# Patient Record
Sex: Male | Born: 1960 | Race: White | Hispanic: No | Marital: Married | Smoking: Never smoker
Health system: Southern US, Community
[De-identification: ages and names within clinical notes are randomized; demographics above are authoritative.]

## PROBLEM LIST (undated history)

## (undated) DIAGNOSIS — K219 Gastro-esophageal reflux disease without esophagitis: Secondary | ICD-10-CM

## (undated) DIAGNOSIS — I809 Phlebitis and thrombophlebitis of unspecified site: Secondary | ICD-10-CM

## (undated) DIAGNOSIS — E7800 Pure hypercholesterolemia, unspecified: Secondary | ICD-10-CM

## (undated) DIAGNOSIS — I10 Essential (primary) hypertension: Secondary | ICD-10-CM

## (undated) DIAGNOSIS — K2270 Barrett's esophagus without dysplasia: Secondary | ICD-10-CM

## (undated) HISTORY — DX: Phlebitis and thrombophlebitis of unspecified site: I80.9

## (undated) HISTORY — PX: UPPER GASTROINTESTINAL ENDOSCOPY: SHX188

## (undated) HISTORY — DX: Barrett's esophagus without dysplasia: K22.70

## (undated) HISTORY — DX: Gastro-esophageal reflux disease without esophagitis: K21.9

## (undated) HISTORY — DX: Essential (primary) hypertension: I10

---

## 2002-05-22 ENCOUNTER — Emergency Department (HOSPITAL_COMMUNITY): Admission: EM | Admit: 2002-05-22 | Discharge: 2002-05-22 | Payer: Self-pay | Admitting: *Deleted

## 2005-11-23 ENCOUNTER — Ambulatory Visit: Payer: Self-pay | Admitting: Internal Medicine

## 2006-06-17 ENCOUNTER — Ambulatory Visit (HOSPITAL_COMMUNITY): Admission: RE | Admit: 2006-06-17 | Discharge: 2006-06-17 | Payer: Self-pay | Admitting: Internal Medicine

## 2006-06-17 DIAGNOSIS — I809 Phlebitis and thrombophlebitis of unspecified site: Secondary | ICD-10-CM

## 2006-06-17 HISTORY — DX: Phlebitis and thrombophlebitis of unspecified site: I80.9

## 2006-12-30 ENCOUNTER — Ambulatory Visit (HOSPITAL_COMMUNITY): Admission: RE | Admit: 2006-12-30 | Discharge: 2006-12-30 | Payer: Self-pay | Admitting: Family Medicine

## 2007-01-21 ENCOUNTER — Ambulatory Visit (HOSPITAL_COMMUNITY): Admission: RE | Admit: 2007-01-21 | Discharge: 2007-01-21 | Payer: Self-pay | Admitting: Urology

## 2007-08-22 ENCOUNTER — Ambulatory Visit: Payer: Self-pay | Admitting: Internal Medicine

## 2007-08-26 ENCOUNTER — Ambulatory Visit (HOSPITAL_COMMUNITY): Admission: RE | Admit: 2007-08-26 | Discharge: 2007-08-26 | Payer: Self-pay | Admitting: Internal Medicine

## 2007-08-26 ENCOUNTER — Ambulatory Visit: Payer: Self-pay | Admitting: Internal Medicine

## 2007-11-20 ENCOUNTER — Ambulatory Visit (HOSPITAL_COMMUNITY): Admission: RE | Admit: 2007-11-20 | Discharge: 2007-11-20 | Payer: Self-pay | Admitting: Otolaryngology

## 2008-09-23 IMAGING — CT CT PELVIS W/O CM
2 of 4 series · 17 of 46 positions shown, 19 images · non-contrast
Comparison: none

CLINICAL DATA: Hematuria, bilateral flank pain

[Series 2: stone 5.0 b40f · axial · 0.73mm/px · z∈[-493,-58]mm · 14 of 95 slices shown, 16 images]
[im 4/95  soft-tissue]
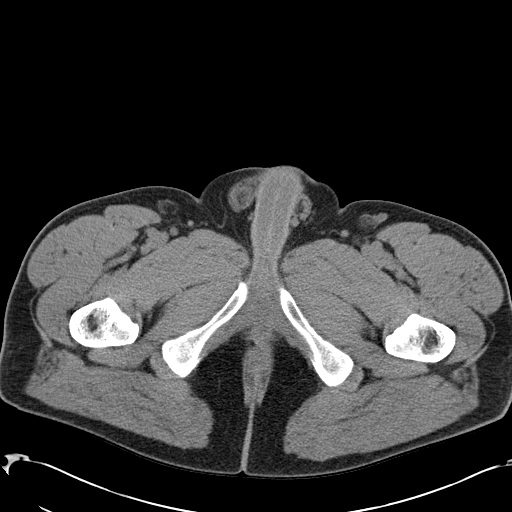
[im 4/95  bone]
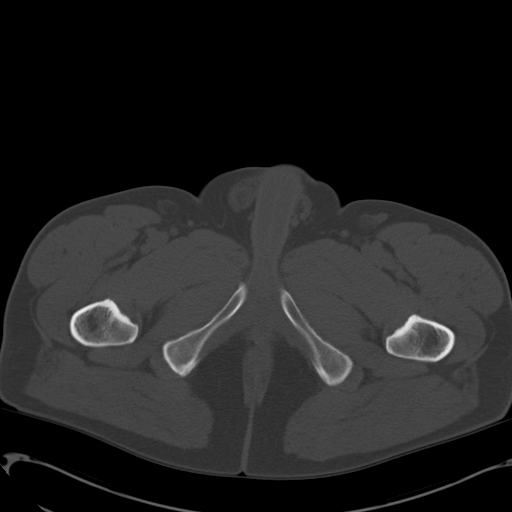
[im 12/95  soft-tissue]
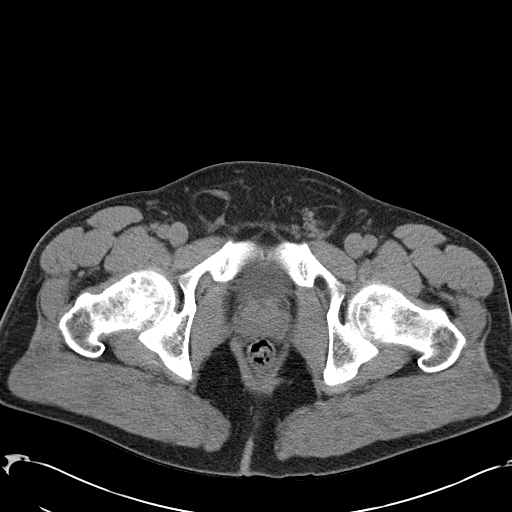
[im 19/95  soft-tissue]
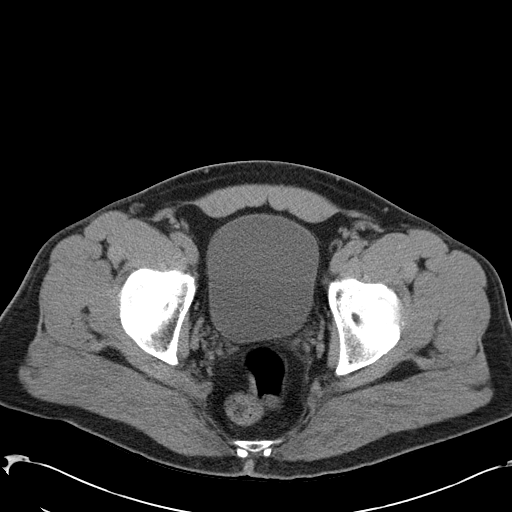
[im 27/95  soft-tissue]
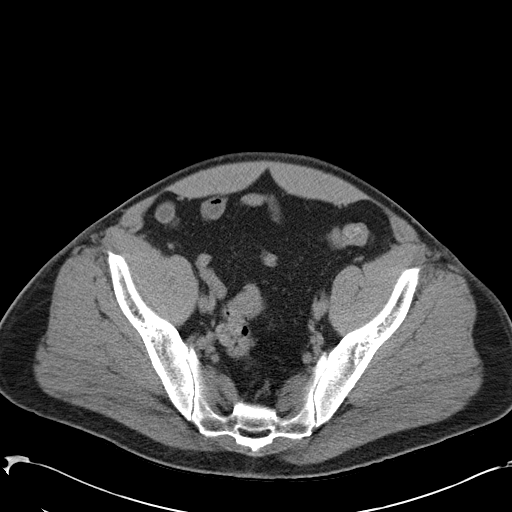
[im 31/95  soft-tissue]
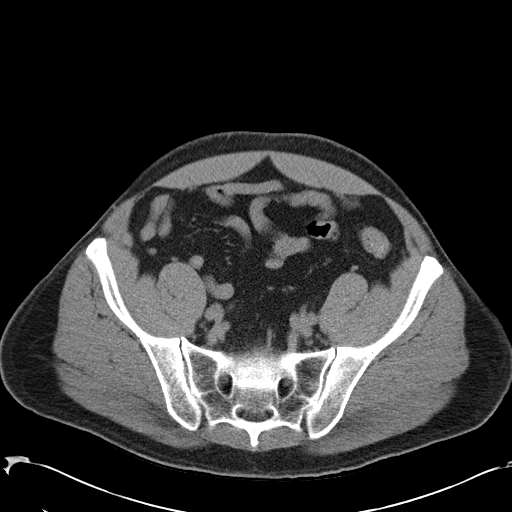
[im 38/95  soft-tissue]
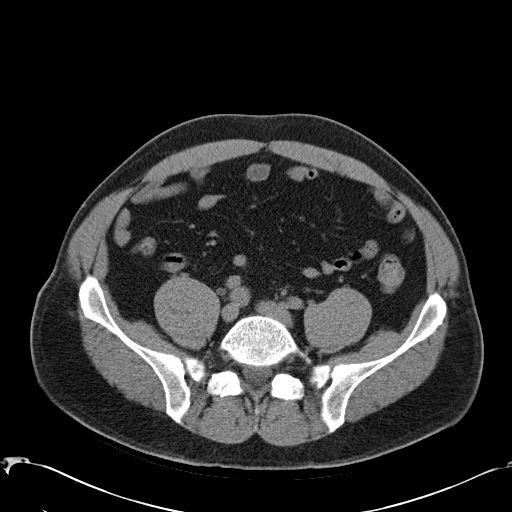
[im 46/95  soft-tissue]
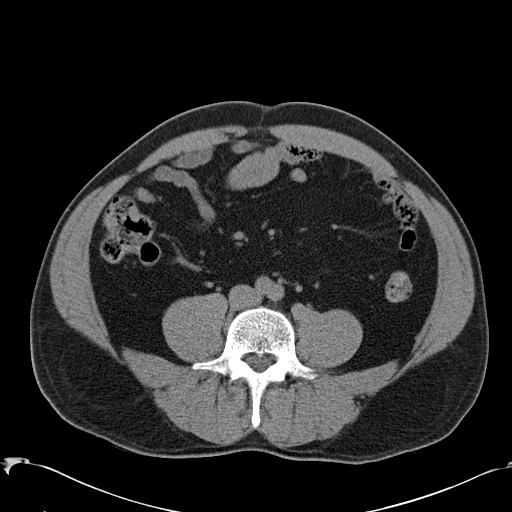
[im 49/95  soft-tissue]
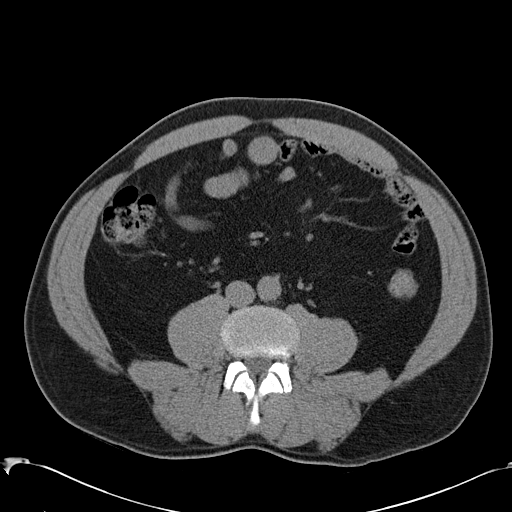
[im 57/95  soft-tissue]
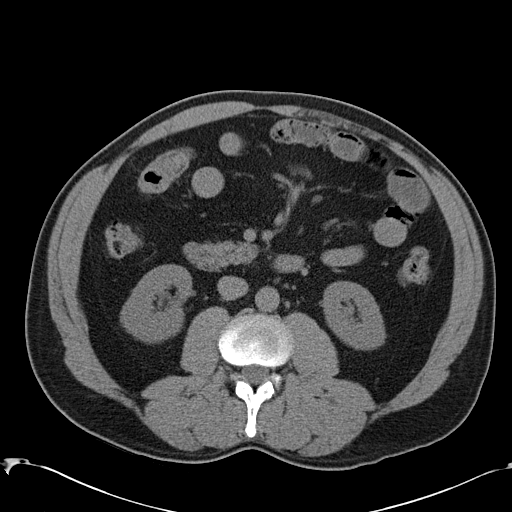
[im 57/95  bone]
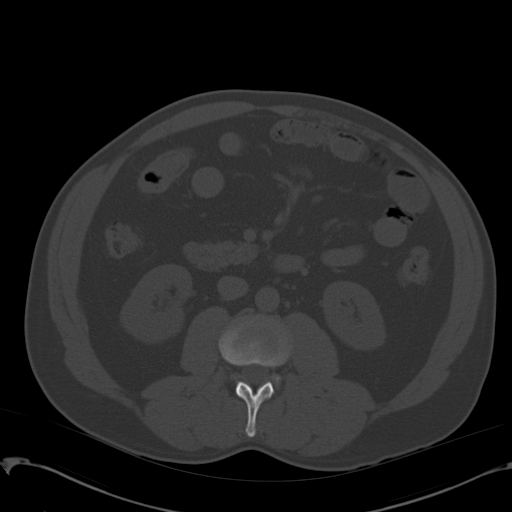
[im 64/95  soft-tissue]
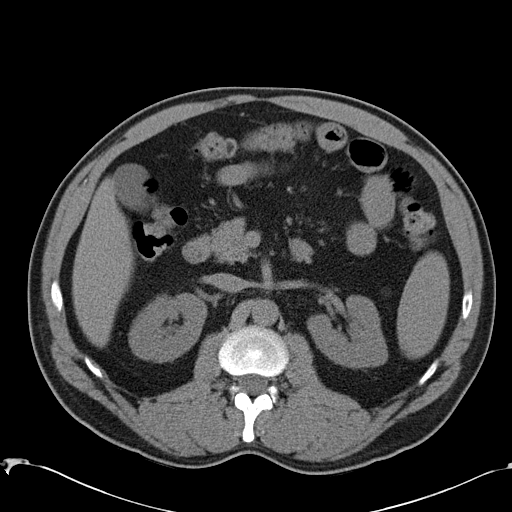
[im 72/95  soft-tissue]
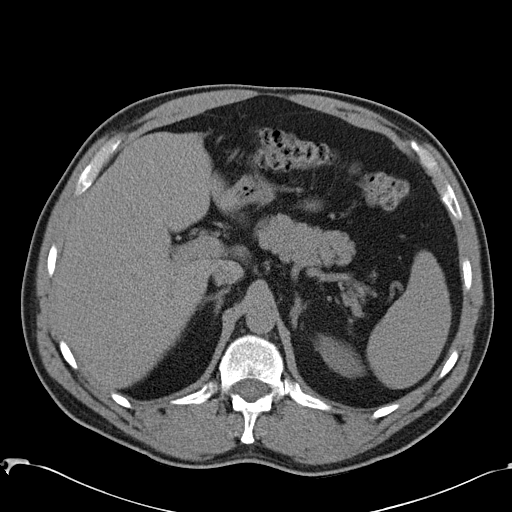
[im 76/95  soft-tissue]
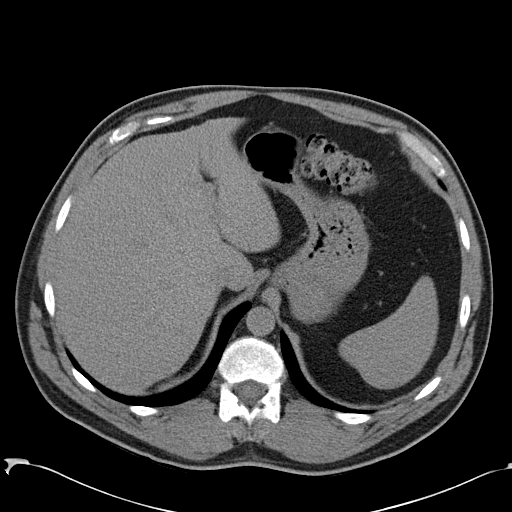
[im 83/95  soft-tissue]
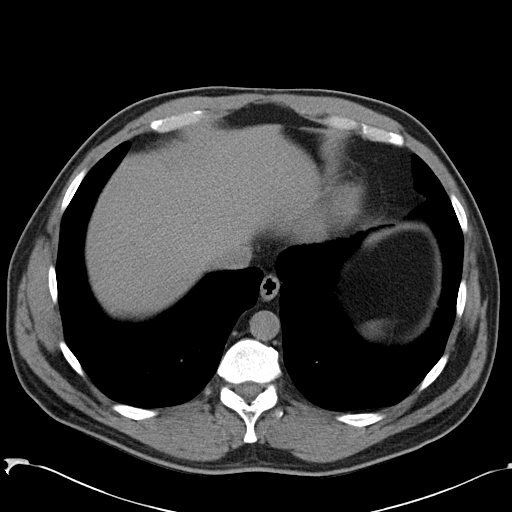
[im 91/95  soft-tissue]
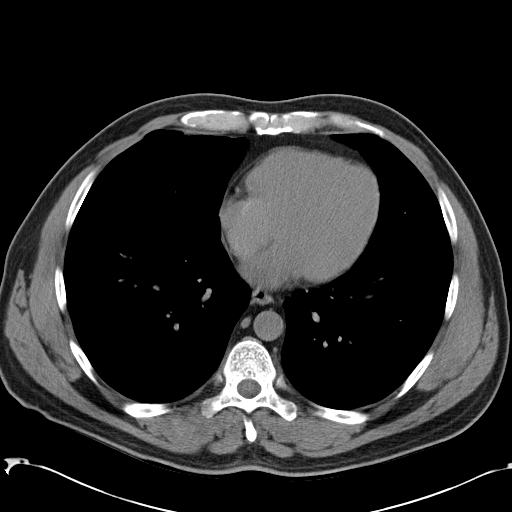

[Series 4: mpr coronal · coronal · 0.93mm/px · 3 of 86 slices shown]
[im 29/86  soft-tissue]
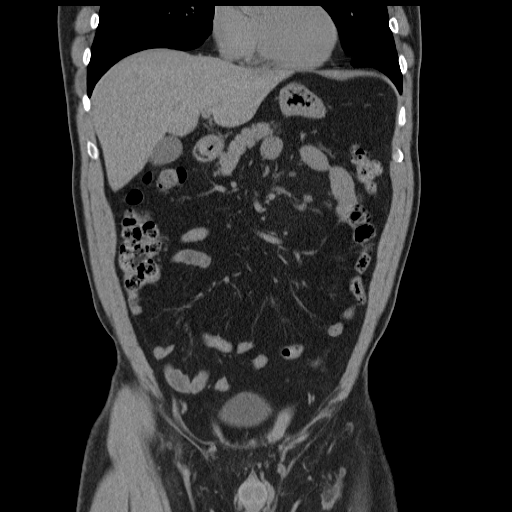
[im 38/86  soft-tissue]
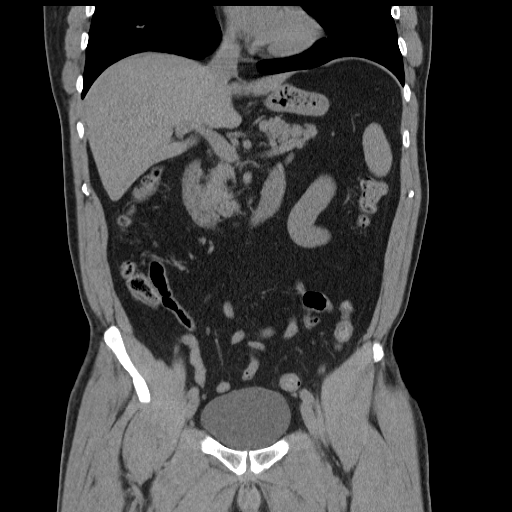
[im 48/86  soft-tissue]
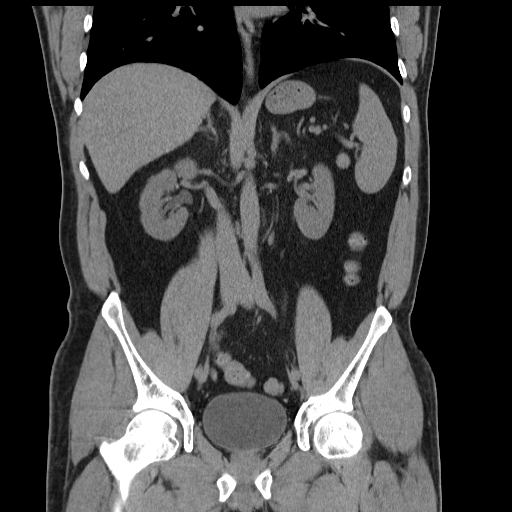

[17 of 46 positions shown; findings below may reference images not displayed]

CT abdomen without contrast:

No previous available for comparison. Visualized lung bases clear. Negative for
nephrolithiasis or hydronephrosis. Unremarkable noncontrast evaluation of
kidneys, adrenal glands, liver, nondistended gallbladder, pancreas, spleen and
accessory splenule. Minimal atheromatous change in the nondilated aorta. No
adenopathy localized. Small bowel is nondilated. No free air. No ascites.
IMPRESSION: 1. Negative for nephrolithiasis or hydronephrosis. Unremarkable CT abdomen.

CT pelvis without contrast:

Normal appendix. Colon is decompressed, unremarkable. No free fluid. Urinary
bladder physiologically distended. No adenopathy. Distal ureters decompressed
without calculus.
IMPRESSION: 1. Negative for distal ureteral calculus.
2. Normal appendix

## 2009-01-20 HISTORY — PX: NISSEN FUNDOPLICATION: SHX2091

## 2009-02-11 ENCOUNTER — Ambulatory Visit (HOSPITAL_COMMUNITY): Admission: RE | Admit: 2009-02-11 | Discharge: 2009-02-12 | Payer: Self-pay | Admitting: General Surgery

## 2009-03-10 ENCOUNTER — Encounter: Payer: Self-pay | Admitting: Internal Medicine

## 2009-04-11 ENCOUNTER — Encounter: Payer: Self-pay | Admitting: Internal Medicine

## 2009-11-10 ENCOUNTER — Encounter: Payer: Self-pay | Admitting: Internal Medicine

## 2009-12-14 ENCOUNTER — Ambulatory Visit (HOSPITAL_COMMUNITY): Admission: RE | Admit: 2009-12-14 | Discharge: 2009-12-14 | Payer: Self-pay | Admitting: Gastroenterology

## 2010-01-23 ENCOUNTER — Encounter (INDEPENDENT_AMBULATORY_CARE_PROVIDER_SITE_OTHER): Payer: Self-pay

## 2010-11-21 NOTE — Letter (Signed)
Summary: OFFICE NOTE/DR ROSENBOWER  OFFICE NOTE/DR ROSENBOWER   Imported By: Diana Eves 11/10/2009 10:18:17  _____________________________________________________________________  External Attachment:    Type:   Image     Comment:   External Document

## 2010-11-21 NOTE — Letter (Signed)
Summary: Recall Colonoscopy/Endoscopy, Change to Office Visit  [facility]  [address]   [place], [place] [address]   Phone: [phone]  Fax: [phone]      January 23, 2010   [name] [address] [place], [place]  [address] Oct 17, 1961   Dear [name],   According to our records, it is time for you to schedule a screening  colonoscopy.  Please call [phone] at your convenience  and ask to speak to the triage nurse to schedule your colonoscopy.   Sincerely,   Cloria Spring LPN  [facility] Ph: [phone]   Fax: [phone]

## 2011-01-31 LAB — DIFFERENTIAL
Basophils Absolute: 0 10*3/uL (ref 0.0–0.1)
Basophils Relative: 0 % (ref 0–1)
Lymphocytes Relative: 31 % (ref 12–46)
Monocytes Relative: 8 % (ref 3–12)
Neutrophils Relative %: 59 % (ref 43–77)

## 2011-01-31 LAB — COMPREHENSIVE METABOLIC PANEL
Alkaline Phosphatase: 54 U/L (ref 39–117)
BUN: 19 mg/dL (ref 6–23)
CO2: 30 mEq/L (ref 19–32)
Calcium: 9.6 mg/dL (ref 8.4–10.5)
Chloride: 105 mEq/L (ref 96–112)
Glucose, Bld: 121 mg/dL — ABNORMAL HIGH (ref 70–99)
Potassium: 4.5 mEq/L (ref 3.5–5.1)
Total Bilirubin: 0.8 mg/dL (ref 0.3–1.2)

## 2011-01-31 LAB — CBC
Hemoglobin: 16.5 g/dL (ref 13.0–17.0)
MCHC: 34.6 g/dL (ref 30.0–36.0)
MCV: 96.5 fL (ref 78.0–100.0)
WBC: 4.8 10*3/uL (ref 4.0–10.5)

## 2011-01-31 LAB — TYPE AND SCREEN: Antibody Screen: NEGATIVE

## 2011-01-31 LAB — ABO/RH: ABO/RH(D): A POS

## 2011-03-06 NOTE — H&P (Signed)
Browning, Jonathan           ACCOUNT NO.:  [id number]   MEDICAL RECORD NO.:  [id number]          PATIENT TYPE:  AMB   LOCATION:  DAY                           FACILITY:  [facility]   PHYSICIAN:  R. Roetta Sessions, M.D. DATE OF BIRTH:  1961/02/14   DATE OF ADMISSION:  DATE OF DISCHARGE:  LH                              HISTORY & PHYSICAL   CHIEF COMPLAINT:  Hoarse, heartburn.   HISTORY OF PRESENT ILLNESS:  [name] is a 50 year old gentleman who  presents today for further evaluation of uncontrollable heartburn and  hoarseness.  He was last seen by our practice in February, 2007.  He has  chronic GERD and IBS.  He states that he was doing very well up until 5-  6 weeks ago.  He had been off and on several medications for  cholesterol, but he had to stop due to side effects and muscle pain.  About four weeks ago, he started taking Benicar/HCT and about four weeks  into it, he started noticing that he was having hoarseness.  He stopped  the medication.  He describes the symptoms coming on abruptly.  He woke  up one morning with them.  He could hardly talk.  He saw Dr. Margo Aye.  His  Nexium was increased to twice a day.  He has been  on this for about  three weeks.  He states that this hoarseness has improved but not  resolved.  He continues to have breakthrough heartburn symptoms.  He  also takes red yeast rice pills for his cholesterol.  He just completed  a six week course of Bactrim for prostatitis.  He denies any dysphagia,  odynophagia, abdominal pain, constipation, diarrhea, melena, or rectal  bleeding.   CURRENT MEDICATIONS:  1. Nexium 40 mg b.i.d.  2. Red yeast pills daily.   ALLERGIES:  CIPRO, MAXIQUIN, FLOXIN.   PAST MEDICAL HISTORY:  1. Hypertension.  2. Hypercholesterolemia.  3. Chronic GERD.  4. IBS.  5. Prostatitis.   His last EGD was in July, 1998 by Dr. Karilyn Cota.  He had a single erosion  at the EG junction with a very small hiatal hernia.  The esophagus was  dilated  with a 54 French Maloney dilator, but no ring appeared either  before or after dilation.  He had a flexible sigmoidoscopy in March,  1995 and had acute colitis with patchy involvement of the sigmoid colon.  Biopsies revealed chronic idiopathic colitis.  It was felt this could be  due to infectious etiology.  The patient really has had no problems at  that time.   No prior surgeries.   FAMILY HISTORY:  Negative for colorectal cancer.   SOCIAL HISTORY:  He is married with two children.  He is self-employed.  He is a nonsmoker.  No alcohol use.   REVIEW OF SYSTEMS:  See HPI for GI.  CONSTITUTIONAL:  He has lost 13  pounds, which has been unintentional.  He is exercising daily.  CARDIOPULMONARY:  No chest pain or shortness of breath.  HEENT:  Complains of hoarseness with no sore throat.   PHYSICAL EXAMINATION:  Weight 190.  Height 5 feet 8.  Temp 98.5, blood  pressure 142/98, pulse 76.  GENERAL:  A pleasant, well-developed and well-nourished Caucasian male  in no acute distress.  SKIN:  Warm and dry.  No jaundice.  HEENT:  Sclerae are anicteric.  Oropharyngeal mucosa moist and pink.  No  lesions, erythema, or exudates.  No lymphadenopathy, thyromegaly.  CHEST:  Lungs are clear to auscultation.  CARDIAC:  Regular rate and rhythm.  Normal S1 and S2.  No murmurs, rubs  or gallops.  ABDOMEN:  Positive bowel sounds.  Abdomen is soft, nontender and  nondistended.  No organomegaly or masses.  No rebound tenderness or  guarding.  No abdominal bruits or hernias.  EXTREMITIES:  No edema.   IMPRESSION:  History of chronic gastroesophageal reflux disease with  breakthrough symptoms, now on Nexium 40 mg b.i.d.  He also complains of  hoarseness, which has been occurring for about five weeks.  This may be  due to laryngopharyngeal reflux.  It has been 10 years since his last  endoscopy.  Recommend one at this time for further evaluation of his  symptoms and to rule out complications of GERD.  I  discussed risks,  benefits and alternatives with the patient with regards to risk of  reaction to medication, bleeding, infection, perforation.  He is  agreeable to proceed.   PLAN:  1. EGD in the near future with Dr. Jena Gauss.  2. Recommend screening colonoscopy at age 54.      Tana Coast, P.AJonathon Bellows, M.D.  Electronically Signed    LL/MEDQ  D:  08/22/2007  T:  08/23/2007  Job:  [id number]   cc:   Catalina Pizza, M.D.  Fax: [phone]

## 2011-03-06 NOTE — Op Note (Signed)
[name], [name]           ACCOUNT NO.:  [id number]   MEDICAL RECORD NO.:  [id number]          PATIENT TYPE:  AMB   LOCATION:  DAY                           FACILITY:  [facility]   PHYSICIAN:  R. Roetta Sessions, M.D. DATE OF BIRTH:  12-07-1960   DATE OF PROCEDURE:  08/26/2007  DATE OF DISCHARGE:                               OPERATIVE REPORT   PROCEDURE:  Diagnostic EGD.   INDICATIONS FOR PROCEDURE:  50 year old gentleman with longstanding  history of gastroesophageal reflux disease which he describes as  heartburn.  He has been on Nexium 40 mg orally daily chronically.  Regimen was increased to b.i.d. early September after he had became  acutely hoarse.  Hoarseness has waxed and waned since that time.  His  typical reflux symptoms have been ameliorated but not completely  controlled with 40 mg of Nexium b.i.d.  He is not having any dysphagia.  He does not use any tobacco products and no prior significant tobacco  exposure.  EGD now being done to further evaluate her refractory  gastroesophageal reflux disease symptoms.  This approach has been  discussed with the patient previously and again at the bedside.  His  questions were answered.  Risks, benefits, and alternatives have been  reviewed.   PROCEDURE NOTE:  O2 saturation, blood pressure, pulse, and respirations  were monitored throughout the entire procedure.  Conscious sedation:  Versed 6 mg IV, Demerol 125 mg IV in divided doses.  Cetacaine spray for  topical oropharyngeal anesthesia.  Instrument:  Pentax video chip  system.   FINDINGS:  Examination of the hypopharynx, I looked at the vocal cords.  They appeared to be quite subtle and obscured by the surrounding mucosa  with some patchy redness.  Please see photos.  They appeared rather  inapparent.  The esophagus was easily intubated.  Examination of the  tubular esophagus revealed a noncritical Schatzki's ring.  There were a  couple of islands of distinct erythema above  the EG junction  suspicious for reflux.  No erosions, ulcerations.  No Barrett's  esophagus.  EG junction easily traversed.  Stomach:  The gastric cavity  was empty and insufflated well with air.  Thorough examination of the  gastric mucosa including retroflexion view of the proximal stomach and  esophagogastric junction demonstrated only a small hiatal hernia.  The  pylorus was patent and easily traversed.  Examination of the bulb and  second portion revealed no abnormalities.   THERAPEUTIC/DIAGNOSTIC MANEUVERS:  None.   The patient tolerated the procedure well and was reactive in endoscopy.   IMPRESSION:  1. Abnormal appearance of vocal cords, significance of which is      uncertain at this time.  2. Localized erythema above the esophagogastric junction, suspicious      for gastroesophageal reflux disease.  3. Noncritical Schatzki's ring, otherwise normal esophagus.  4. Hiatal hernia, otherwise normal stomach, first and second portions      of the duodenum.   Clinically and endoscopically, this gentleman is having breakthrough  gastroesophageal reflux disease on Nexium 40 mg orally b.i.d.  He has  been on this regimen for 2  months.  As far as his hoarseness is  concerned, his hoarseness began rather acutely 2 months ago.  Endoscopic  findings and the onset of symptoms certainly could be caused by a  significant episode of laryngopharyngeal reflux.  If this is a reflux-  induced scenario, it may take a good 3-6 months with aggressive acid  suppression to improve those symptoms.  However, reflux is only one of  many, many causes for hoarseness.  I am glad to know there is no  significant tobacco exposure.   RECOMMENDATIONS:  1. Stop Nexium and begin Prevacid 30 mg capsules 1 b.i.d. (i.e.,      before breakfast and before supper).  2. Antireflux literature provided Mr. Abercrombie.  3. He should have an ENT evaluation to evaluate this nice gentleman      for other causes of  hoarseness.  I have recommend he see Dr. Lucky Cowboy in the near future.  Will also provide the patient with      endoscopic photographs taken today for the otolaryngologist.  Will      plan to see this nice gentleman back in 3 months.  Again, I      emphasized that if he does have an element of LPR, it may take      upwards of 3-6 months to appreciate clinical improvement, but he      continues to have heartburn on Nexium t.i.d. which warrants change      in his acid suppression regimen.      Jonathon Bellows, M.D.  Electronically Signed     RMR/MEDQ  D:  08/26/2007  T:  08/26/2007  Job:  [id number]   cc:   Catalina Pizza, M.D.  Fax: [phone]   Lucky Cowboy, MD  Fax: [phone]

## 2011-03-06 NOTE — Op Note (Signed)
[name], [name]           ACCOUNT NO.:  [id number]   MEDICAL RECORD NO.:  [id number]          PATIENT TYPE:  AMB   LOCATION:  DAY                          FACILITY:  [facility]   PHYSICIAN:  Adolph Pollack, M.D.DATE OF BIRTH:  17-Aug-1961   DATE OF PROCEDURE:  02/11/2009  DATE OF DISCHARGE:                               OPERATIVE REPORT   PREOPERATIVE DIAGNOSES:  Gastroesophageal reflux disease.   POSTOPERATIVE DIAGNOSES:  Gastroesophageal reflux disease.   PROCEDURE:  Laparoscopic Nissen fundal plication (over size 56 dilator).   SURGEON:  Adolph Pollack, M.D.   ASSISTANT:  Bertram Savin, M.D.   ANESTHESIA:  General.   INDICATIONS:  This is a 50 year old male with a longstanding history of  gastroesophageal reflux disease that is symptomatic.  He also has  supraesophageal manifestations.  Despite medication, he continues to  have breakthrough symptoms.  Manometry is normal.  His 24-hour pH study  was normal, but only with double-dose proton pump inhibitor.  We have  discussed the option of Nissen fundoplication to avoid long-term  medication and because of medical failure, and he thus presents for  that.   TECHNIQUE:  He was seen in the holding area, then brought to the  operating room, placed supine on the operating table and a general  anesthetic was administered.  The hair on the abdominal wall was  clipped.  The abdominal wall was sterilely prepped and draped.   In the supraumbilical area, a small incision was made through skin,  subcutaneous tissue, fascia and peritoneum.  A 5-mm trocar was then  placed into the peritoneal cavity and CO2 gas insufflated.  Following  this, a 10-mm trocar was inserted and a laparoscope inserted and no  evidence of intestinal or intra-abdominal organ or vascular injury was  noted.   The angled scope was then placed through the trocar and he was placed in  reverse Trendelenburg position.  An 11-mm trocar was placed in the  right  upper quadrant laterally and one the right upper quadrant medially.  An  11-mm trocar was placed in the left upper quadrant laterally.  A 5-mm  incision was then made in the subxiphoid area and a self-retaining liver  retractor was inserted through this incision.  The left lobe of the  liver was then retracted anteriorly, exposing the esophageal hiatus.   The gastroesophageal junction was then grasped and retracted laterally.  No significant hiatal hernia was noted.  The thin gastrohepatic ligament  was then divided, using the harmonic scalpel, up to the level of the  right crus.  The thin phrenoesophageal ligament anteriorly was divided  with the harmonic scalpel over to the left crus.  Using careful blunt  dissection, I separated the right crus from the esophagus and identified  the retroesophageal space.   Following this, I identified the proximal fundus and divided short  gastric vessels, using harmonic scalpel to the level of the left crus.  I was able to mobilize the cardia, fundus and angle of his.  I then used  blunt dissection to create a retroesophageal window and passed a Penrose  drain through this.  Using the Penrose drain, the gastroesophageal  junction was retracted anteriorly.  I then went ahead and tightened the  hiatus with two interrupted, pledgeted size 0 nonabsorbable sutures.  Following this, I then brought the fundus through the retroesophageal  window and then fashioned a wrap with the right and left leaves, using  the fundus.  A size 56 dilator was then passed down the esophagus into  the stomach.   Using 0 nonabsorbable sutures, I then created a 2.5-cm 360-degree wrap.  First two proximal bites of the wrap included the left leaf of the wrap,  superficial bite of the esophagus, and the right leaf of the wrap.  The  most distal suture just included the left and right leaf of the wrap.  Once this was done, the dilator was removed.  The wrap was under no   tension and it was floppy with the sutures lying at about the 10-11  o'clock position.   I next inspected the area carefully in all quadrants and noted no  bleeding.  There was no obvious injury to the esophagus or stomach or  intestine.  The liver retractor was then removed.  I then removed the  remaining trocars and released the pneumoperitoneum.   The small fascial defect in the supraumbilical area was closed with a  single 0 Vicryl suture.  The skin incisions were then closed with 4-0  Monocryl subcuticular stitches, followed by Steri-Strips and sterile  dressings.  He tolerated the procedure without any apparent  complications and was taken to recovery in satisfactory condition.      Adolph Pollack, M.D.  Electronically Signed     TJR/MEDQ  D:  02/11/2009  T:  02/11/2009  Job:  [id number]   cc:   Lucky Cowboy, MD  Fax: [phone]   Angus G. Renard Matter, MD  Fax: [phone]   R. Roetta Sessions, M.D.  [address]  [place]  [place] [address]

## 2011-04-20 ENCOUNTER — Ambulatory Visit (HOSPITAL_COMMUNITY)
Admission: RE | Admit: 2011-04-20 | Discharge: 2011-04-20 | Disposition: A | Discharge status: Home / Self Care | Payer: 59 | Source: Ambulatory Visit | Attending: Family Medicine | Admitting: Family Medicine

## 2011-04-20 ENCOUNTER — Other Ambulatory Visit (HOSPITAL_COMMUNITY): Payer: Self-pay | Admitting: Family Medicine

## 2011-04-20 DIAGNOSIS — R918 Other nonspecific abnormal finding of lung field: Secondary | ICD-10-CM | POA: Insufficient documentation

## 2011-04-20 DIAGNOSIS — Z8719 Personal history of other diseases of the digestive system: Secondary | ICD-10-CM

## 2011-04-20 DIAGNOSIS — K219 Gastro-esophageal reflux disease without esophagitis: Secondary | ICD-10-CM | POA: Insufficient documentation

## 2011-05-02 ENCOUNTER — Ambulatory Visit (INDEPENDENT_AMBULATORY_CARE_PROVIDER_SITE_OTHER): Payer: 59 | Admitting: Internal Medicine

## 2011-05-02 DIAGNOSIS — K219 Gastro-esophageal reflux disease without esophagitis: Secondary | ICD-10-CM

## 2011-07-24 ENCOUNTER — Ambulatory Visit (INDEPENDENT_AMBULATORY_CARE_PROVIDER_SITE_OTHER): Payer: 59 | Admitting: Internal Medicine

## 2011-07-24 ENCOUNTER — Encounter (INDEPENDENT_AMBULATORY_CARE_PROVIDER_SITE_OTHER): Payer: Self-pay | Admitting: Internal Medicine

## 2011-07-24 VITALS — BP 110/74 | HR 76 | Temp 98.4°F | Resp 14 | Ht 68.0 in | Wt 196.0 lb

## 2011-07-24 DIAGNOSIS — K219 Gastro-esophageal reflux disease without esophagitis: Secondary | ICD-10-CM

## 2011-07-24 MED ORDER — PANTOPRAZOLE SODIUM 40 MG PO TBEC: 40.0000 mg | DELAYED_RELEASE_TABLET | Freq: Every day | ORAL | Status: DC

## 2011-07-24 NOTE — Patient Instructions (Signed)
Decrease Pantoprazole to 40 mg daily before breakfast Call if symptoms worse again.

## 2011-07-29 NOTE — Progress Notes (Signed)
Presenting complaint; followup for GERD and throat symptoms Subjective; patient is 50 year old Caucasian male who was long history of GERD. Symptoms are not well controlled with anti-reflex measures. He had laparoscopic Nissen fundoplication in April 2010. He came to office in July of this year with complaints of burning sensation in his chest and throat every morning and he also had to cough up thick  mucus or phlegm. He already had been on Nexium. He was switched to pantoprazole twice daily. He states he is feeling better; he does not have cough on some days. He is still coughing up thick phlegm on some days. 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 He says he had a chest x-ray a few months ago and was normal. He has not had heartburn recently he is not having burning in his throat or chest anymore. He also denies regurgitation around nose. He is still unable to burp Current medications; Aspirin 81 mg by mouth daily Centrum Silver one mouth daily Benicar HCT 20/12.5 mg daily Pantoprazole 40 mg twice daily Pitavastin calcium 4 mg by mouth daily. Objective; BP 110/74  Pulse 76  Temp(Src) 98.4 F (36.9 C) (Oral)  Resp 14  Ht 5\' 8"  (1.727 m)  Wt 196 lb (88.905 kg)  BMI 29.80 kg/m2 Conjunctiva is pink. Sclerae nonicteric. Oropharyngeal mucosa is normal No neck masses or thyromegaly noted Lungs are clear to auscultation Abdominal exam is normal  No LE edema noted Assessment #1 . Chronic GERD. Patient's burning in his throat has improved and is having less coughing spells but he still having to cough up thick phlegm most mornings. I do not believe his last symptom is due to GERD. I suspect this is due to postnasal discharge although he has not responded to antihistamine makes before. Plan Decrease pantoprazole to 40 mg every morning. Call if symptoms get worse otherwise return for office visit in one year.

## 2012-05-30 ENCOUNTER — Other Ambulatory Visit (INDEPENDENT_AMBULATORY_CARE_PROVIDER_SITE_OTHER): Payer: Self-pay | Admitting: Internal Medicine

## 2012-07-30 ENCOUNTER — Encounter (INDEPENDENT_AMBULATORY_CARE_PROVIDER_SITE_OTHER): Payer: Self-pay | Admitting: *Deleted

## 2012-08-18 ENCOUNTER — Other Ambulatory Visit (INDEPENDENT_AMBULATORY_CARE_PROVIDER_SITE_OTHER): Payer: Self-pay | Admitting: *Deleted

## 2012-08-18 ENCOUNTER — Encounter (INDEPENDENT_AMBULATORY_CARE_PROVIDER_SITE_OTHER): Payer: Self-pay | Admitting: Internal Medicine

## 2012-08-18 ENCOUNTER — Ambulatory Visit (INDEPENDENT_AMBULATORY_CARE_PROVIDER_SITE_OTHER): Payer: 59 | Admitting: Internal Medicine

## 2012-08-18 ENCOUNTER — Telehealth (INDEPENDENT_AMBULATORY_CARE_PROVIDER_SITE_OTHER): Payer: Self-pay | Admitting: *Deleted

## 2012-08-18 VITALS — BP 110/70 | HR 72 | Temp 98.0°F | Ht 68.0 in | Wt 209.5 lb

## 2012-08-18 DIAGNOSIS — Z1211 Encounter for screening for malignant neoplasm of colon: Secondary | ICD-10-CM

## 2012-08-18 DIAGNOSIS — I10 Essential (primary) hypertension: Secondary | ICD-10-CM | POA: Insufficient documentation

## 2012-08-18 DIAGNOSIS — K219 Gastro-esophageal reflux disease without esophagitis: Secondary | ICD-10-CM

## 2012-08-18 MED ORDER — DICYCLOMINE HCL 10 MG PO CAPS: 10.0000 mg | ORAL_CAPSULE | ORAL | Status: DC | PRN

## 2012-08-18 NOTE — Patient Instructions (Addendum)
Screening colonoscopy 

## 2012-08-18 NOTE — Progress Notes (Signed)
Subjective:     Patient ID: [name], male   DOB: 17-Dec-1960, 51 y.o.   MRN: [id number]  HPI States he is doing pretty good. Occasionally has acid reflux. Sometimes he has a cough but then resolves.  He had fundal wrap in 2010.  Appetite is good. No weight loss. BM x 1 day. No melena or bright red rectal bleeding. He has never undergone a screening colonoscopy.  Review of Systems     Current Outpatient Prescriptions  Medication Sig Dispense Refill  . aspirin 81 MG tablet Take 81 mg by mouth daily.        Marland Kitchen ezetimibe (ZETIA) 10 MG tablet Take 10 mg by mouth daily.      . Multiple Vitamins-Minerals (CENTRUM SILVER PO) Take by mouth daily.        Marland Kitchen olmesartan-hydrochlorothiazide (BENICAR HCT) 20-12.5 MG per tablet Take 1 tablet by mouth daily.        . pantoprazole (PROTONIX) 40 MG tablet TAKE 1 TABLET (40 MG TOTAL) BY MOUTH DAILY BEFORE BREAKFAST.  90 tablet  3  . dicyclomine (BENTYL) 10 MG capsule Take 1 capsule (10 mg total) by mouth as needed.  90 capsule  2  . Pitavastatin Calcium (LIVALO) 4 MG TABS Take by mouth daily.         Past Medical History  Diagnosis Date  . GERD (gastroesophageal reflux disease)   . Hypertension    Past Surgical History  Procedure Date  . Nissen fundoplication 01/2009  . Upper gastrointestinal endoscopy    History   Social History  . Marital Status: Married    Spouse Name: N/A    Number of Children: N/A  . Years of Education: N/A   Occupational History  . Not on file.   Social History Main Topics  . Smoking status: Never Smoker   . Smokeless tobacco: Never Used  . Alcohol Use: No     Beer once in a while  . Drug Use: No  . Sexually Active: Yes   Other Topics Concern  . Not on file   Social History Narrative  . No narrative on file   Family Status  Relation Status Death Age  . Mother Alive   . Father Alive   . Brother Alive   . Brother Alive   . Daughter Alive   . Son Alive    Allergies  Allergen Reactions  .  Ciprofloxacin     Joint Pain  . Floxin (Ofloxacin)     Joint pain    Objective:   Physical Exam Filed Vitals:   08/18/12 1559  BP: 110/70  Pulse: 72  Temp: 98 F (36.7 C)  Height: 5\' 8"  (1.727 m)  Weight: 209 lb 8 oz (95.029 kg)   Alert and oriented. Skin warm and dry. Oral mucosa is moist.   . Sclera anicteric, conjunctivae is pink. Thyroid not enlarged. No cervical lymphadenopathy. Lungs clear. Heart regular rate and rhythm.  Abdomen is soft. Bowel sounds are positive. No hepatomegaly. No abdominal masses felt. No tenderness.  No edema to lower extremities.       Assessment:    Genella Rife controlled at this time. In need of screening colonoscopy.    Plan:     Screening colonscopy. Continue the Protonix. OV in 1 yr.

## 2012-08-18 NOTE — Telephone Encounter (Signed)
Patient needs movi prep 

## 2012-08-22 MED ORDER — PEG-KCL-NACL-NASULF-NA ASC-C 100 G PO SOLR: 1.0000 | Freq: Once | ORAL | Status: DC

## 2012-09-02 ENCOUNTER — Encounter (HOSPITAL_COMMUNITY): Payer: Self-pay | Admitting: Pharmacy Technician

## 2012-09-10 ENCOUNTER — Encounter (INDEPENDENT_AMBULATORY_CARE_PROVIDER_SITE_OTHER): Payer: Self-pay | Admitting: *Deleted

## 2012-09-10 NOTE — Progress Notes (Signed)
This encounter was created in error - please disregard.

## 2012-09-11 ENCOUNTER — Ambulatory Visit (HOSPITAL_COMMUNITY)
Admission: RE | Admit: 2012-09-11 | Discharge: 2012-09-11 | Disposition: A | Discharge status: Home / Self Care | Payer: 59 | Source: Ambulatory Visit | Attending: Internal Medicine | Admitting: Internal Medicine

## 2012-09-11 ENCOUNTER — Encounter (HOSPITAL_COMMUNITY): Payer: Self-pay | Admitting: *Deleted

## 2012-09-11 ENCOUNTER — Encounter (HOSPITAL_COMMUNITY)
Admission: RE | Disposition: A | Discharge status: Home / Self Care | Payer: Self-pay | Source: Ambulatory Visit | Attending: Internal Medicine

## 2012-09-11 DIAGNOSIS — Z1211 Encounter for screening for malignant neoplasm of colon: Secondary | ICD-10-CM

## 2012-09-11 DIAGNOSIS — E7800 Pure hypercholesterolemia, unspecified: Secondary | ICD-10-CM | POA: Insufficient documentation

## 2012-09-11 DIAGNOSIS — K219 Gastro-esophageal reflux disease without esophagitis: Secondary | ICD-10-CM | POA: Insufficient documentation

## 2012-09-11 DIAGNOSIS — I10 Essential (primary) hypertension: Secondary | ICD-10-CM | POA: Insufficient documentation

## 2012-09-11 DIAGNOSIS — K648 Other hemorrhoids: Secondary | ICD-10-CM | POA: Insufficient documentation

## 2012-09-11 DIAGNOSIS — K644 Residual hemorrhoidal skin tags: Secondary | ICD-10-CM

## 2012-09-11 HISTORY — PX: COLONOSCOPY: SHX5424

## 2012-09-11 HISTORY — DX: Pure hypercholesterolemia, unspecified: E78.00

## 2012-09-11 SURGERY — COLONOSCOPY
Anesthesia: Moderate Sedation

## 2012-09-11 MED ORDER — STERILE WATER FOR IRRIGATION IR SOLN
Status: DC | PRN
  Administered 2012-09-11: 14:00:00

## 2012-09-11 MED ORDER — MEPERIDINE HCL 50 MG/ML IJ SOLN
INTRAMUSCULAR | Status: AC
  Filled 2012-09-11: qty 1

## 2012-09-11 MED ORDER — MEPERIDINE HCL 50 MG/ML IJ SOLN
INTRAMUSCULAR | Status: DC | PRN
Start: 2012-09-11 — End: 2012-09-11
  Administered 2012-09-11 (×2): 25 mg via INTRAVENOUS

## 2012-09-11 MED ORDER — MIDAZOLAM HCL 5 MG/5ML IJ SOLN
INTRAMUSCULAR | Status: AC
  Filled 2012-09-11: qty 10

## 2012-09-11 MED ORDER — MIDAZOLAM HCL 5 MG/5ML IJ SOLN
INTRAMUSCULAR | Status: DC | PRN
  Administered 2012-09-11 (×4): 2 mg via INTRAVENOUS

## 2012-09-11 MED ORDER — SODIUM CHLORIDE 0.45 % IV SOLN
INTRAVENOUS | Status: DC
  Administered 2012-09-11: 13:00:00 via INTRAVENOUS

## 2012-09-11 NOTE — Op Note (Signed)
COLONOSCOPY PROCEDURE REPORT  PATIENT:  [name]  MR#:  [id number] Birthdate:  [birth year], 51 y.o., male Endoscopist:  Dr. Malissa Hippo, MD Referred By:  Dr. and his Edilia Bo, MD Procedure Date: 09/11/2012  Procedure:   Colonoscopy  Indications:  Patient is 51 year old Caucasian male was undergoing average risk screening colonoscopy.  Informed Consent:  The procedure and risks were reviewed with the patient and informed consent was obtained.  Medications:  Demerol 50 mg IV Versed 8 mg IV  Description of procedure:  After a digital rectal exam was performed, that colonoscope was advanced from the anus through the rectum and colon to the area of the cecum, ileocecal valve and appendiceal orifice. The cecum was deeply intubated. These structures were well-seen and photographed for the record. From the level of the cecum and ileocecal valve, the scope was slowly and cautiously withdrawn. The mucosal surfaces were carefully surveyed utilizing scope tip to flexion to facilitate fold flattening as needed. The scope was pulled down into the rectum where a thorough exam including retroflexion was performed.  Findings:   Prep excellent. Normal mucosa throughout. Normal rectal mucosa. Small hemorrhoids above the dentate line.  Therapeutic/Diagnostic Maneuvers Performed:  None  Complications:  None  Cecal Withdrawal Time:  12 minutes  Impression:  Normal colonoscopy except small internal hemorrhoids.  Recommendations:  Standard instructions given. Next screening exam in 10 years.  Ashmi Blas U  09/11/2012 2:05 PM  CC: Dr. Alice Reichert, MD & Dr. Bonnetta Barry ref. provider found

## 2012-09-11 NOTE — H&P (Signed)
[name] is an 51 y.o. male.   Chief Complaint: Patient is for colonoscopy. HPI: Patient is 51 year old Caucasian male who is in for screening colonoscopy. He denies abdominal pain change in his bowel habits or rectal bleeding. This is patient's first exam. Family history is negative for colorectal carcinoma.  Past Medical History  Diagnosis Date  . GERD (gastroesophageal reflux disease)   . Hypertension   . Hypercholesteremia     Past Surgical History  Procedure Date  . Nissen fundoplication 01/2009  . Upper gastrointestinal endoscopy     Family History  Problem Relation Age of Onset  . Diabetes Mother   . Heart disease Father   . Diabetes Brother   . Healthy Brother   . Healthy Daughter   . Healthy Son   . Colon cancer Neg Hx    Social History:  reports that he has never smoked. He has never used smokeless tobacco. He reports that he drinks about 7.2 ounces of alcohol per week. He reports that he does not use illicit drugs.  Allergies:  Allergies  Allergen Reactions  . Ciprofloxacin Other (See Comments)    Joint Pain  . Floxin (Ofloxacin) Other (See Comments)    Joint pain    Medications Prior to Admission  Medication Sig Dispense Refill  . ezetimibe (ZETIA) 10 MG tablet Take 10 mg by mouth daily.      Marland Kitchen olmesartan-hydrochlorothiazide (BENICAR HCT) 20-12.5 MG per tablet Take 1 tablet by mouth daily.        . pantoprazole (PROTONIX) 40 MG tablet Take 40 mg by mouth daily.      . peg 3350 powder (MOVIPREP) 100 G SOLR Take 1 kit (100 g total) by mouth once.  1 kit  0  . aspirin 81 MG tablet Take 81 mg by mouth daily.        Marland Kitchen dicyclomine (BENTYL) 10 MG capsule Take 10 mg by mouth as needed. Stomach Cramps      . Multiple Vitamins-Minerals (CENTRUM SILVER PO) Take 1 tablet by mouth daily.         No results found for this or any previous visit (from the past 48 hour(s)). No results found.  ROS  Blood pressure 135/89, pulse 74, temperature 97.6 F (36.4  C), temperature source Oral, resp. rate 22, height 5\' 8"  (1.727 m), weight 209 lb (94.802 kg), SpO2 99.00%. Physical Exam  Constitutional: He appears well-developed and well-nourished.  HENT:  Mouth/Throat: Oropharynx is clear and moist.  Eyes: Conjunctivae normal are normal. No scleral icterus.  Neck: No thyromegaly present.  Cardiovascular: Normal rate, regular rhythm and normal heart sounds.   No murmur heard. Respiratory: Effort normal.  GI: Soft. He exhibits no distension and no mass. There is no tenderness.  Musculoskeletal: He exhibits no edema.  Lymphadenopathy:    He has no cervical adenopathy.  Neurological: He is alert.  Skin: Skin is warm and dry.     Assessment/Plan Range risk screening colonoscopy.  Makai Agostinelli U 09/11/2012, 1:33 PM

## 2012-09-15 ENCOUNTER — Encounter (HOSPITAL_COMMUNITY): Payer: Self-pay | Admitting: Internal Medicine

## 2013-06-29 ENCOUNTER — Other Ambulatory Visit (INDEPENDENT_AMBULATORY_CARE_PROVIDER_SITE_OTHER): Payer: Self-pay | Admitting: Internal Medicine

## 2013-07-24 ENCOUNTER — Encounter (INDEPENDENT_AMBULATORY_CARE_PROVIDER_SITE_OTHER): Payer: Self-pay | Admitting: *Deleted

## 2013-10-13 ENCOUNTER — Encounter (INDEPENDENT_AMBULATORY_CARE_PROVIDER_SITE_OTHER): Payer: Self-pay | Admitting: Internal Medicine

## 2013-10-13 ENCOUNTER — Ambulatory Visit (INDEPENDENT_AMBULATORY_CARE_PROVIDER_SITE_OTHER): Payer: 59 | Admitting: Internal Medicine

## 2013-10-13 VITALS — BP 130/80 | HR 72 | Temp 98.5°F | Resp 18 | Ht 68.0 in | Wt 203.6 lb

## 2013-10-13 DIAGNOSIS — K219 Gastro-esophageal reflux disease without esophagitis: Secondary | ICD-10-CM

## 2013-10-13 MED ORDER — PANTOPRAZOLE SODIUM 40 MG PO TBEC: DELAYED_RELEASE_TABLET | ORAL | Status: DC

## 2013-10-13 NOTE — Progress Notes (Signed)
Presenting complaint;  Followup for chronic GERD.  Database/subjective;  [name] is a 52 year old Caucasian male who presents for her yearly visit. He has history of chronic GERD. He underwent antireflux surgery in April 2010. He has required PPI for control of his symptoms. Prior to surgery PPI was not working. He has occasional heartburn. However if he does not take pantoprazole heartburn and regurgitation relapses within a matter of a few days. He denies dysphagia hoarseness or chronic cough. 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 He denies abdominal pain diarrhea constipation melena or rectal bleeding. He has lost 6 pounds since his last visit. He does try to walk regularly. He is up-to-date on screening for CRC. Last colonoscopy was in November 2013 and next screening due in November 2023.   Current Medications: Current Outpatient Prescriptions  Medication Sig Dispense Refill  . aspirin 81 MG tablet Take 81 mg by mouth daily.        Marland Kitchen dicyclomine (BENTYL) 10 MG capsule Take 10 mg by mouth as needed. Stomach Cramps      . ezetimibe (ZETIA) 10 MG tablet Take 10 mg by mouth daily.      . Multiple Vitamins-Minerals (CENTRUM SILVER PO) Take 1 tablet by mouth daily.       Marland Kitchen olmesartan-hydrochlorothiazide (BENICAR HCT) 20-12.5 MG per tablet Take 1 tablet by mouth daily.        . pantoprazole (PROTONIX) 40 MG tablet TAKE 1 TABLET BY MOUTH DAILY BEFORE BREAKFAST.  90 tablet  3   No current facility-administered medications for this visit.     Objective: Blood pressure 130/80, pulse 72, temperature 98.5 F (36.9 C), temperature source Oral, resp. rate 18, height 5\' 8"  (1.727 m), weight 203 lb 9.6 oz (92.352 kg). Patient is alert and in no acute distress. Conjunctiva is pink. Sclera is nonicteric Oropharyngeal mucosa is normal. No neck masses or thyromegaly noted. Abdomen is full but soft and nontender without organomegaly or  masses.  No LE edema or clubbing noted.    Assessment:  #1. Chronic GERD. He is doing well with current therapy. His symptoms could not be well controlled with double dose PPI prior to anti-deflux surgery. Now he is requiring single-dose PPI for symptom control.    Plan:  Continue anti-reflex measures. New prescription for pantoprazole 40 mg daily 90 doses with 3 refills given. Office visit in one year.

## 2013-10-13 NOTE — Patient Instructions (Signed)
Call if pantoprazole stops working. 

## 2014-02-25 ENCOUNTER — Emergency Department (HOSPITAL_COMMUNITY): Payer: 59

## 2014-02-25 ENCOUNTER — Encounter (HOSPITAL_COMMUNITY): Payer: Self-pay | Admitting: Emergency Medicine

## 2014-02-25 ENCOUNTER — Emergency Department (HOSPITAL_COMMUNITY)
Admission: EM | Admit: 2014-02-25 | Discharge: 2014-02-26 | Disposition: A | Discharge status: Home / Self Care | Payer: 59 | Attending: Emergency Medicine | Admitting: Emergency Medicine

## 2014-02-25 ENCOUNTER — Other Ambulatory Visit: Payer: Self-pay

## 2014-02-25 DIAGNOSIS — I10 Essential (primary) hypertension: Secondary | ICD-10-CM | POA: Insufficient documentation

## 2014-02-25 DIAGNOSIS — K219 Gastro-esophageal reflux disease without esophagitis: Secondary | ICD-10-CM | POA: Insufficient documentation

## 2014-02-25 DIAGNOSIS — E7800 Pure hypercholesterolemia, unspecified: Secondary | ICD-10-CM | POA: Insufficient documentation

## 2014-02-25 DIAGNOSIS — Z7982 Long term (current) use of aspirin: Secondary | ICD-10-CM | POA: Insufficient documentation

## 2014-02-25 DIAGNOSIS — Z79899 Other long term (current) drug therapy: Secondary | ICD-10-CM | POA: Insufficient documentation

## 2014-02-25 DIAGNOSIS — R079 Chest pain, unspecified: Secondary | ICD-10-CM | POA: Insufficient documentation

## 2014-02-25 LAB — CBC
HEMATOCRIT: 44.8 % (ref 39.0–52.0)
Hemoglobin: 16.1 g/dL (ref 13.0–17.0)
MCH: 33.5 pg (ref 26.0–34.0)
MCHC: 35.9 g/dL (ref 30.0–36.0)
MCV: 93.3 fL (ref 78.0–100.0)
Platelets: 178 10*3/uL (ref 150–400)
RBC: 4.8 MIL/uL (ref 4.22–5.81)
RDW: 11.6 % (ref 11.5–15.5)
WBC: 5.7 10*3/uL (ref 4.0–10.5)

## 2014-02-25 LAB — BASIC METABOLIC PANEL
BUN: 21 mg/dL (ref 6–23)
CHLORIDE: 98 meq/L (ref 96–112)
CO2: 26 mEq/L (ref 19–32)
CREATININE: 1.18 mg/dL (ref 0.50–1.35)
Calcium: 9.4 mg/dL (ref 8.4–10.5)
GFR calc non Af Amer: 68 mL/min — ABNORMAL LOW (ref >90)
GFR, EST AFRICAN AMERICAN: 79 mL/min — AB (ref >90)
GLUCOSE: 107 mg/dL — AB (ref 70–99)
Potassium: 3.7 mEq/L (ref 3.7–5.3)
Sodium: 139 mEq/L (ref 137–147)

## 2014-02-25 LAB — I-STAT TROPONIN, ED: Troponin i, poc: 0.01 ng/mL (ref 0.00–0.08)

## 2014-02-25 MED ORDER — GI COCKTAIL ~~LOC~~
30.0000 mL | Freq: Once | ORAL | Status: AC
  Administered 2014-02-25: 30 mL via ORAL
  Filled 2014-02-25: qty 30

## 2014-02-25 NOTE — ED Notes (Signed)
Pt in c/o central chest pain over the last two weeks, pain is intermittent, worse today, pt is worse with movement, denies shortness of breath or n/v, no distress noted

## 2014-02-25 NOTE — ED Provider Notes (Signed)
CSN: [id number]     Arrival date & time 02/25/14  1941 History   First MD Initiated Contact with Patient 02/25/14 2338     Chief Complaint  Patient presents with  . Chest Pain     (Consider location/radiation/quality/duration/timing/severity/associated sxs/prior Treatment) Patient is a 53 y.o. male presenting with chest pain.  Chest Pain Associated symptoms: no abdominal pain, no back pain, no cough, no fever, no headache, no shortness of breath and not vomiting    Hx per PT - intermittent substernal and left-sided chest pains for the last 2 weeks. sharp in quality. 2 weeks ago had "hard" pain lasting seconds. The following day was better, still present and mild ever since. Tonight pain returning and more significant. He has associated belching and is taking PPI as prescribed for h/o GERD s/p Nissen fundoplication. No back pain. No SOB. No cough. No F/C. No leg pain or leg swelling.  Past Medical History  Diagnosis Date  . GERD (gastroesophageal reflux disease)   . Hypertension   . Hypercholesteremia    Past Surgical History  Procedure Laterality Date  . Nissen fundoplication  00/8676  . Upper gastrointestinal endoscopy    . Colonoscopy  09/11/2012    Procedure: COLONOSCOPY;  Surgeon: Rogene Houston, MD;  Location: [facility];  Service: Endoscopy;  Laterality: N/A;  200   Family History  Problem Relation Age of Onset  . Diabetes Mother   . Heart disease Father   . Diabetes Brother   . Healthy Brother   . Healthy Daughter   . Healthy Son   . Colon cancer Neg Hx    History  Substance Use Topics  . Smoking status: Never Smoker   . Smokeless tobacco: Never Used  . Alcohol Use: 7.2 oz/week    12 Cans of beer per week    Review of Systems  Constitutional: Negative for fever and chills.  Respiratory: Negative for cough and shortness of breath.   Cardiovascular: Positive for chest pain.  Gastrointestinal: Negative for vomiting and abdominal pain.  Genitourinary:  Negative for dysuria and flank pain.  Musculoskeletal: Negative for back pain, neck pain and neck stiffness.  Skin: Negative for rash.  Neurological: Negative for headaches.  All other systems reviewed and are negative.     Allergies  Ciprofloxacin and Floxin  Home Medications   Prior to Admission medications   Medication Sig Start Date End Date Taking? Authorizing Provider  aspirin 81 MG tablet Take 81 mg by mouth daily.     Yes Historical Provider, MD  dicyclomine (BENTYL) 10 MG capsule Take 10 mg by mouth as needed. Stomach Cramps 08/18/12  Yes Butch Penny, NP  ezetimibe (ZETIA) 10 MG tablet Take 10 mg by mouth daily.   Yes Historical Provider, MD  Multiple Vitamins-Minerals (CENTRUM SILVER PO) Take 1 tablet by mouth daily.    Yes Historical Provider, MD  olmesartan-hydrochlorothiazide (BENICAR HCT) 20-12.5 MG per tablet Take 1 tablet by mouth daily.     Yes Historical Provider, MD  Omega-3 Fatty Acids (FISH OIL) 1000 MG CAPS Take 1,000 mg by mouth daily.   Yes Historical Provider, MD  pantoprazole (PROTONIX) 40 MG tablet TAKE 1 TABLET BY MOUTH DAILY BEFORE BREAKFAST. 10/13/13  Yes Rogene Houston, MD   BP 129/88  Pulse 70  Temp(Src) 98.3 F (36.8 C) (Oral)  Resp 11  SpO2 96% Physical Exam  Constitutional: He is oriented to person, place, and time. He appears well-developed and well-nourished.  HENT:  Head:  Normocephalic and atraumatic.  Mouth/Throat: Oropharynx is clear and moist.  Eyes: Conjunctivae and EOM are normal. Pupils are equal, round, and reactive to light. No scleral icterus.  Neck: Neck supple.  Cardiovascular: Normal rate, regular rhythm and intact distal pulses.   Pulmonary/Chest: Effort normal and breath sounds normal. No respiratory distress. He exhibits no tenderness.  Abdominal: Soft. Bowel sounds are normal. He exhibits no distension and no mass. There is no tenderness. There is no rebound and no guarding.  Musculoskeletal: Normal range of motion. He  exhibits no edema and no tenderness.  Neurological: He is alert and oriented to person, place, and time. No cranial nerve deficit.  Skin: Skin is warm and dry.    ED Course  Procedures (including critical care time) Labs Review Labs Reviewed  BASIC METABOLIC PANEL - Abnormal; Notable for the following:    Glucose, Bld 107 (*)    GFR calc non Af Amer 68 (*)    GFR calc Af Amer 79 (*)    All other components within normal limits  CBC  I-STAT TROPOININ, ED  I-STAT TROPOININ, ED    Imaging Review Dg Chest 2 View  02/25/2014   CLINICAL DATA:  Chest pain  EXAM: CHEST  2 VIEW  COMPARISON:  DG CHEST 2 VIEW dated 04/20/2011  FINDINGS: The heart size and mediastinal contours are within normal limits. Both lungs are clear. The visualized skeletal structures are unremarkable.  IMPRESSION: No active cardiopulmonary disease.   Electronically Signed   By: Kathreen Devoid   On: 02/25/2014 21:46    Date: 02/25/2014  Rate: 92  Rhythm: normal sinus rhythm  QRS Axis: normal  Intervals: normal  ST/T Wave abnormalities: nonspecific ST changes  Conduction Disutrbances:none  Narrative Interpretation:   Old EKG Reviewed: none available  GI cocktail given, and on recheck decreased pain.  Patient's wife works in the cardiology office and perform stress testing. Patient agrees to call in the morning to get a stress test.  He agrees to strict return precautions.  All questions answered.  MDM   Diagnosis: Chest pain  Substernal pain with history of Nissen fundoplication and belching, on and off for last 2 weeks.  No dyspnea or ACS symptoms otherwise. Evaluated with EKG, chest x-ray and labs reviewed as above. Serial troponins negative. Medications provided. Vital signs and nursing notes reviewed and considered.    Teressa Lower, MD 02/26/14 [phone]

## 2014-02-26 ENCOUNTER — Encounter (HOSPITAL_COMMUNITY): Payer: Self-pay

## 2014-02-26 ENCOUNTER — Encounter: Payer: Self-pay | Admitting: Cardiology

## 2014-02-26 ENCOUNTER — Telehealth: Payer: Self-pay

## 2014-02-26 ENCOUNTER — Ambulatory Visit (HOSPITAL_COMMUNITY)
Admission: RE | Admit: 2014-02-26 | Discharge: 2014-02-26 | Disposition: A | Discharge status: Home / Self Care | Payer: 59 | Source: Ambulatory Visit | Attending: Cardiology | Admitting: Cardiology

## 2014-02-26 ENCOUNTER — Ambulatory Visit (INDEPENDENT_AMBULATORY_CARE_PROVIDER_SITE_OTHER): Payer: 59 | Admitting: Cardiology

## 2014-02-26 VITALS — BP 129/77 | HR 64 | Ht 68.0 in | Wt 199.0 lb

## 2014-02-26 DIAGNOSIS — R079 Chest pain, unspecified: Secondary | ICD-10-CM

## 2014-02-26 LAB — I-STAT TROPONIN, ED: Troponin i, poc: 0 ng/mL (ref 0.00–0.08)

## 2014-02-26 MED ORDER — FAMOTIDINE 20 MG PO TABS: 20.0000 mg | ORAL_TABLET | Freq: Two times a day (BID) | ORAL | Status: DC

## 2014-02-26 MED ORDER — OXYCODONE-ACETAMINOPHEN 5-325 MG PO TABS
2.0000 | ORAL_TABLET | Freq: Once | ORAL | Status: AC
  Administered 2014-02-26: 2 via ORAL
  Filled 2014-02-26: qty 2

## 2014-02-26 NOTE — Progress Notes (Signed)
Stress Lab Nurses Notes - [facility]   ALEXYS GASSETT  02/26/2014  Reason for doing test: Chest Pain  Type of test: Regular GTX  Nurse performing test: Gerrit Halls, RN  MD performing test: Myles Gip  Family MD: [facility]  Test explained and consent signed: Yes  IV started: No IV started  Symptoms: Mild chest pain prior to test - no change or limitation during exercise Treatment/Intervention: None  Reason test stopped: Exceeded target HR  After recovery IV was: NA  Patient discharged: Home  Patient's Condition upon discharge was: Stable  Comments: During test peak BP 192/64 & HR !69. Recovery BP 107/70 & HR 95. Continues to have chest pain.  Donnajean Lopes  Attending note:  Patient exercised on Bruce protocol for 12:00 reaching peak heart rate 169 BPM, 100% MPHR. Maximum workload 13.4 METS. Peak blood pressure 192/64 - hypertensive response. No worsening in mild, atypical chest pain at baseline. No diagnostic ST segment changes to indicate ischemia. No arrhythmias. Low risk Duke treadmill score of 12.  Satira Sark, M.D., F.A.C.C.

## 2014-02-26 NOTE — Discharge Instructions (Signed)
Chest Pain (Nonspecific) °It is often hard to give a specific diagnosis for the cause of chest pain. There is always a chance that your pain could be related to something serious, such as a heart attack or a blood clot in the lungs. You need to follow up with your caregiver for further evaluation. °CAUSES  °· Heartburn. °· Pneumonia or bronchitis. °· Anxiety or stress. °· Inflammation around your heart (pericarditis) or lung (pleuritis or pleurisy). °· A blood clot in the lung. °· A collapsed lung (pneumothorax). It can develop suddenly on its own (spontaneous pneumothorax) or from injury (trauma) to the chest. °· Shingles infection (herpes zoster virus). °The chest wall is composed of bones, muscles, and cartilage. Any of these can be the source of the pain. °· The bones can be bruised by injury. °· The muscles or cartilage can be strained by coughing or overwork. °· The cartilage can be affected by inflammation and become sore (costochondritis). °DIAGNOSIS  °Lab tests or other studies, such as X-rays, electrocardiography, stress testing, or cardiac imaging, may be needed to find the cause of your pain.  °TREATMENT  °· Treatment depends on what may be causing your chest pain. Treatment may include: °· Acid blockers for heartburn. °· Anti-inflammatory medicine. °· Pain medicine for inflammatory conditions. °· Antibiotics if an infection is present. °· You may be advised to change lifestyle habits. This includes stopping smoking and avoiding alcohol, caffeine, and chocolate. °· You may be advised to keep your head raised (elevated) when sleeping. This reduces the chance of acid going backward from your stomach into your esophagus. °· Most of the time, nonspecific chest pain will improve within 2 to 3 days with rest and mild pain medicine. °HOME CARE INSTRUCTIONS  °· If antibiotics were prescribed, take your antibiotics as directed. Finish them even if you start to feel better. °· For the next few days, avoid physical  activities that bring on chest pain. Continue physical activities as directed. °· Do not smoke. °· Avoid drinking alcohol. °· Only take over-the-counter or prescription medicine for pain, discomfort, or fever as directed by your caregiver. °· Follow your caregiver's suggestions for further testing if your chest pain does not go away. °· Keep any follow-up appointments you made. If you do not go to an appointment, you could develop lasting (chronic) problems with pain. If there is any problem keeping an appointment, you must call to reschedule. °SEEK MEDICAL CARE IF:  °· You think you are having problems from the medicine you are taking. Read your medicine instructions carefully. °· Your chest pain does not go away, even after treatment. °· You develop a rash with blisters on your chest. °SEEK IMMEDIATE MEDICAL CARE IF:  °· You have increased chest pain or pain that spreads to your arm, neck, jaw, back, or abdomen. °· You develop shortness of breath, an increasing cough, or you are coughing up blood. °· You have severe back or abdominal pain, feel nauseous, or vomit. °· You develop severe weakness, fainting, or chills. °· You have a fever. °THIS IS AN EMERGENCY. Do not wait to see if the pain will go away. Get medical help at once. Call your local emergency services (911 in [place] Do not drive yourself to the hospital. °MAKE SURE YOU:  °· Understand these instructions. °· Will watch your condition. °· Will get help right away if you are not doing well or get worse. °Document Released: 07/18/2005 Document Revised: 12/31/2011 Document Reviewed: 05/13/2008 °ExitCare® Patient Information ©2014 [employer]   [employer]. ° °

## 2014-02-26 NOTE — Telephone Encounter (Signed)
LM on patients voice mail that gxt was normal.

## 2014-02-26 NOTE — Progress Notes (Signed)
Stress Lab Nurses Notes - [facility] Na  [name] 02/26/2014 Reason for doing test: Chest Pain Type of test: Regular GTX Nurse performing test: Gerrit Halls, RN Nuclear Medicine Tech: Not Applicable Echo Tech: Not Applicable MD performing test: Myles Gip Family MD: [facility] Test explained and consent signed: yes IV started: No IV started Symptoms: chest pain prior to test Treatment/Intervention: None Reason test stopped: reached target HR After recovery IV was: NA Patient to return to [name]. Med at : NA Patient discharged: Home Patient's Condition upon discharge was: stable Comments: During test peak BP 192/64 & HR !69.  Recovery BP 107/70 & HR 95.  Continues to have chest pain. Donnajean Lopes

## 2014-02-26 NOTE — Progress Notes (Signed)
Clinical Summary Mr. Monnig is a 53 y.o.male seen today as an ER follow up for chest pain.  1. Chest pain - started approx 2 weeks ago. Occurred while sitting in truck. Sharp pain that was severe left chest. No other symptoms. Worst with movement, worst with deep breathing. Lasted several hours up until he went asleep. - occurred again the next day.  - another episode yesterday morning with waking. This was a pressure pain. No other associated symptoms. Felt all day yesterday. Worst with shoveling.  - increased belching over the last few days. Just started pepcid by GI doc, was on protonix previously - denies any DOE  - per ER notes symptoms better with GI cocktail EKG with NSR, no ischemic changes. CXR no acute process. Trop neg x 2.   CAD risk factors: HTN, hyperlipidemia. Father MI 63, paternal grandfather MI 52s.   Past Medical History  Diagnosis Date  . GERD (gastroesophageal reflux disease)   . Hypertension   . Hypercholesteremia      Allergies  Allergen Reactions  . Ciprofloxacin Other (See Comments)    Joint Pain  . Floxin [Ofloxacin] Other (See Comments)    Joint pain     Current Outpatient Prescriptions  Medication Sig Dispense Refill  . aspirin 81 MG tablet Take 81 mg by mouth daily.        Marland Kitchen dicyclomine (BENTYL) 10 MG capsule Take 10 mg by mouth as needed. Stomach Cramps      . ezetimibe (ZETIA) 10 MG tablet Take 10 mg by mouth daily.      . famotidine (PEPCID) 20 MG tablet Take 1 tablet (20 mg total) by mouth 2 (two) times daily.  30 tablet  0  . Multiple Vitamins-Minerals (CENTRUM SILVER PO) Take 1 tablet by mouth daily.       Marland Kitchen olmesartan-hydrochlorothiazide (BENICAR HCT) 20-12.5 MG per tablet Take 1 tablet by mouth daily.        . Omega-3 Fatty Acids (FISH OIL) 1000 MG CAPS Take 1,000 mg by mouth daily.      . pantoprazole (PROTONIX) 40 MG tablet TAKE 1 TABLET BY MOUTH DAILY BEFORE BREAKFAST.  90 tablet  3   No current facility-administered  medications for this visit.     Past Surgical History  Procedure Laterality Date  . Nissen fundoplication  64/4034  . Upper gastrointestinal endoscopy    . Colonoscopy  09/11/2012    Procedure: COLONOSCOPY;  Surgeon: Rogene Houston, MD;  Location: [facility];  Service: Endoscopy;  Laterality: N/A;  200     Allergies  Allergen Reactions  . Ciprofloxacin Other (See Comments)    Joint Pain  . Floxin [Ofloxacin] Other (See Comments)    Joint pain      Family History  Problem Relation Age of Onset  . Diabetes Mother   . Heart disease Father   . Diabetes Brother   . Healthy Brother   . Healthy Daughter   . Healthy Son   . Colon cancer Neg Hx      Social History Mr. Woodmansee reports that he has never smoked. He has never used smokeless tobacco. Mr. Snee reports that he drinks about 7.2 ounces of alcohol per week.   Review of Systems CONSTITUTIONAL: No weight loss, fever, chills, weakness or fatigue.  HEENT: Eyes: No visual loss, blurred vision, double vision or yellow sclerae.No hearing loss, sneezing, congestion, runny nose or sore throat.  SKIN: No rash or itching.  CARDIOVASCULAR: per HPI  RESPIRATORY: No shortness of breath, cough or sputum.  GASTROINTESTINAL: No anorexia, nausea, vomiting or diarrhea. No abdominal pain or blood.  GENITOURINARY: No burning on urination, no polyuria NEUROLOGICAL: No headache, dizziness, syncope, paralysis, ataxia, numbness or tingling in the extremities. No change in bowel or bladder control.  MUSCULOSKELETAL: No muscle, back pain, joint pain or stiffness.  LYMPHATICS: No enlarged nodes. No history of splenectomy.  PSYCHIATRIC: No history of depression or anxiety.  ENDOCRINOLOGIC: No reports of sweating, cold or heat intolerance. No polyuria or polydipsia.  Marland Kitchen   Physical Examination p 64 bp 129/77 Wt 199 lbs BMI 30 Gen: resting comfortably, no acute distress HEENT: no scleral icterus, pupils equal round and reactive,  no palptable cervical adenopathy,  CV: RRR, no m/r/g, no JVD, no carotid bruits Resp: Clear to auscultation bilaterally GI: abdomen is soft, non-tender, non-distended, normal bowel sounds, no hepatosplenomegaly MSK: extremities are warm, no edema.  Skin: warm, no rash Neuro:  no focal deficits Psych: appropriate affect   Diagnostic Studies Feb 25 2014 EKG NSR, no ischemic changes    Assessment and Plan  1. Chest pain - fairly atypical for cardiac chest pain - he does have multiple CAD risk factors including a strong FH of early CAD - will obtain GXT to further evaluate and risk stratify      Arnoldo Lenis, M.D., F.A.C.C.

## 2014-02-26 NOTE — Progress Notes (Signed)
Please let patient know stress test is normal. Can follow up with [place] as needed.    Zandra Abts MD

## 2014-02-26 NOTE — Patient Instructions (Addendum)
Your physician recommends that you schedule a follow-up appointment in: to be determined after stress test    Your physician has requested that you have an exercise tolerance test. For further information please visit [email or web address]. Please also follow instruction sheet, as given.       Thank you for choosing [facility] !   

## 2014-03-04 ENCOUNTER — Ambulatory Visit (INDEPENDENT_AMBULATORY_CARE_PROVIDER_SITE_OTHER): Payer: 59 | Admitting: Internal Medicine

## 2014-03-04 ENCOUNTER — Encounter (INDEPENDENT_AMBULATORY_CARE_PROVIDER_SITE_OTHER): Payer: Self-pay | Admitting: Internal Medicine

## 2014-03-04 ENCOUNTER — Encounter (INDEPENDENT_AMBULATORY_CARE_PROVIDER_SITE_OTHER): Payer: Self-pay | Admitting: *Deleted

## 2014-03-04 VITALS — BP 118/62 | HR 72 | Temp 98.3°F | Ht 68.0 in | Wt 199.7 lb

## 2014-03-04 DIAGNOSIS — R141 Gas pain: Secondary | ICD-10-CM

## 2014-03-04 DIAGNOSIS — R142 Eructation: Secondary | ICD-10-CM

## 2014-03-04 DIAGNOSIS — R079 Chest pain, unspecified: Secondary | ICD-10-CM

## 2014-03-04 DIAGNOSIS — R143 Flatulence: Secondary | ICD-10-CM

## 2014-03-04 DIAGNOSIS — R140 Abdominal distension (gaseous): Secondary | ICD-10-CM

## 2014-03-04 NOTE — Patient Instructions (Addendum)
HIDA scan, I will discuss with Dr. Laural Golden. If normal, may need an EGD

## 2014-03-04 NOTE — Progress Notes (Signed)
Subjective:     Patient ID: [name], male   DOB: Aug 12, 1961, 53 y.o.   MRN: [id number]  HPI Presents today with c/o chest pain.  Underwent a recent stress test which was essential normal. About 3 weeks ago, he got up in his truck, he began to have left sided chest pain occurred after eating a fiber bar and yogurt.  The pain continued till the next day at lunch. He says he continues to have some chest pain when he palpates his chest.  He was seen in the ED his cardiac work up was negative. Serial troponins x 2 were normal. EKG was normal He describes the pain as bloating, and sometimes it will throb.  He says he did have heart burn and he also began to burping. He says has far as his acid reflux, he is better. Received GI cocktail in the ED   not get relief with a GI and he reports he did not get any relief with the medication.  He says he has a constant pressure and pain in the left side of his chest. Pain however is better.  If he pushes on his chest, he has pain.  If he does a lot of lifting or shoveling, it becomes worse. He has a lot of mucous in his throat.   2011: EGD:  Scarring of vocal cords. One sessile gastric polyp in mid body not removed. By Dr. Collene Mares  03/08/2014 Stress test:Patient exercised on Bruce protocol for 12:00 reaching peak heart rate 169 BPM, 100% MPHR. Maximum workload 13.4 METS. Peak blood pressure 192/64 - hypertensive response. No worsening in mild, atypical chest pain at baseline. No diagnostic ST segment changes to indicate ischemia. No arrhythmias. Low risk Duke treadmill score of 12. Chest xray was normal. CBC    Component Value Date/Time   WBC 5.7 02/25/2014 1950   RBC 4.80 02/25/2014 1950   HGB 16.1 02/25/2014 1950   HCT 44.8 02/25/2014 1950   PLT 178 02/25/2014 1950   MCV 93.3 02/25/2014 1950   MCH 33.5 02/25/2014 1950   MCHC 35.9 02/25/2014 1950   RDW 11.6 02/25/2014 1950   LYMPHSABS 1.5 02/08/2009 0825   MONOABS 0.4 02/08/2009 0825   EOSABS 0.1 02/08/2009 0825   BASOSABS 0.0 02/08/2009 0825        Review of Systems Past Medical History  Diagnosis Date  . GERD (gastroesophageal reflux disease)   . Hypertension   . Hypercholesteremia     Past Surgical History  Procedure Laterality Date  . Nissen fundoplication  73/2202  . Upper gastrointestinal endoscopy    . Colonoscopy  09/11/2012    Procedure: COLONOSCOPY;  Surgeon: Rogene Houston, MD;  Location: [facility];  Service: Endoscopy;  Laterality: N/A;  200    Allergies  Allergen Reactions  . Ciprofloxacin Other (See Comments)    Joint Pain  . Floxin [Ofloxacin] Other (See Comments)    Joint pain    Current Outpatient Prescriptions on File Prior to Visit  Medication Sig Dispense Refill  . aspirin 81 MG tablet Take 81 mg by mouth daily.        Marland Kitchen dicyclomine (BENTYL) 10 MG capsule Take 10 mg by mouth as needed. Stomach Cramps      . ezetimibe (ZETIA) 10 MG tablet Take 10 mg by mouth daily.      . famotidine (PEPCID) 20 MG tablet Take 1 tablet (20 mg total) by mouth 2 (two) times daily.  30 tablet  0  .  Multiple Vitamins-Minerals (CENTRUM SILVER PO) Take 1 tablet by mouth daily.       Marland Kitchen olmesartan-hydrochlorothiazide (BENICAR HCT) 20-12.5 MG per tablet Take 1 tablet by mouth daily.        . Omega-3 Fatty Acids (FISH OIL) 1000 MG CAPS Take 1,000 mg by mouth daily.      . pantoprazole (PROTONIX) 40 MG tablet TAKE 1 TABLET BY MOUTH DAILY BEFORE BREAKFAST.  90 tablet  3   No current facility-administered medications on file prior to visit.        Objective:   Physical Exam  Filed Vitals:   03/04/14 1605  BP: 118/62  Pulse: 72  Temp: 98.3 F (36.8 C)  Height: 5\' 8"  (1.727 m)  Weight: 199 lb 11.2 oz (90.583 kg)  Alert and oriented. Skin warm and dry. Oral mucosa is moist.   . Sclera anicteric, conjunctivae is pink. Thyroid not enlarged. No cervical lymphadenopathy. Lungs clear. Heart regular rate and rhythm.  Abdomen is soft. Bowel sounds are positive. No hepatomegaly. No  abdominal masses felt. No tenderness.  No edema to lower extremities.        Assessment:    Bloating, Chest pain. Occurred after eating. Recent cardiac work up was WNL. GB disease needs to be ruled out. If HIDA scan normal, I need to talk with Dr. Laural Golden about a possible EGD.    Plan:    HIDA scan. If normal may need an EGD

## 2014-03-05 DIAGNOSIS — R140 Abdominal distension (gaseous): Secondary | ICD-10-CM | POA: Insufficient documentation

## 2014-03-10 ENCOUNTER — Encounter (HOSPITAL_COMMUNITY)
Admission: RE | Admit: 2014-03-10 | Discharge: 2014-03-10 | Disposition: A | Discharge status: Home / Self Care | Payer: 59 | Source: Ambulatory Visit | Attending: Internal Medicine | Admitting: Internal Medicine

## 2014-03-10 ENCOUNTER — Encounter (HOSPITAL_COMMUNITY): Payer: Self-pay

## 2014-03-10 DIAGNOSIS — R143 Flatulence: Principal | ICD-10-CM

## 2014-03-10 DIAGNOSIS — R079 Chest pain, unspecified: Secondary | ICD-10-CM | POA: Insufficient documentation

## 2014-03-10 DIAGNOSIS — R142 Eructation: Principal | ICD-10-CM

## 2014-03-10 DIAGNOSIS — R141 Gas pain: Secondary | ICD-10-CM | POA: Insufficient documentation

## 2014-03-10 DIAGNOSIS — R140 Abdominal distension (gaseous): Secondary | ICD-10-CM

## 2014-03-10 MED ORDER — TECHNETIUM TC 99M MEBROFENIN IV KIT
5.0000 | PACK | Freq: Once | INTRAVENOUS | Status: AC | PRN
  Administered 2014-03-10: 5 via INTRAVENOUS

## 2014-03-10 MED ORDER — SINCALIDE 5 MCG IJ SOLR
INTRAMUSCULAR | Status: AC
  Administered 2014-03-10: 1.77 ug
  Filled 2014-03-10: qty 5

## 2014-03-10 MED ORDER — STERILE WATER FOR INJECTION IJ SOLN
INTRAMUSCULAR | Status: AC
  Administered 2014-03-10: 5 mL
  Filled 2014-03-10: qty 10

## 2014-03-29 ENCOUNTER — Telehealth (INDEPENDENT_AMBULATORY_CARE_PROVIDER_SITE_OTHER): Payer: Self-pay | Admitting: *Deleted

## 2014-03-29 NOTE — Telephone Encounter (Signed)
Phone call left on machine Friday 11:59  Wife called said he is still have problems with his chest. Has had Hida and Stress test  Wants to know what is the next step.  Please call him on mobile # [phone]

## 2014-03-29 NOTE — Telephone Encounter (Signed)
I have spoken with patient. I have asked Tammy to have Dr. Laural Golden call patient.

## 2014-04-13 ENCOUNTER — Encounter (INDEPENDENT_AMBULATORY_CARE_PROVIDER_SITE_OTHER): Payer: Self-pay

## 2014-07-28 ENCOUNTER — Encounter (INDEPENDENT_AMBULATORY_CARE_PROVIDER_SITE_OTHER): Payer: Self-pay | Admitting: *Deleted

## 2015-01-05 ENCOUNTER — Other Ambulatory Visit (INDEPENDENT_AMBULATORY_CARE_PROVIDER_SITE_OTHER): Payer: Self-pay | Admitting: Internal Medicine

## 2015-03-07 ENCOUNTER — Ambulatory Visit (INDEPENDENT_AMBULATORY_CARE_PROVIDER_SITE_OTHER): Payer: 59 | Admitting: Internal Medicine

## 2015-03-07 ENCOUNTER — Encounter (INDEPENDENT_AMBULATORY_CARE_PROVIDER_SITE_OTHER): Payer: Self-pay | Admitting: Internal Medicine

## 2015-03-07 VITALS — BP 112/58 | HR 60 | Temp 98.0°F | Ht 68.0 in | Wt 215.4 lb

## 2015-03-07 DIAGNOSIS — K219 Gastro-esophageal reflux disease without esophagitis: Secondary | ICD-10-CM

## 2015-03-07 NOTE — Patient Instructions (Signed)
OV in 1 yr.  Continue the Protonix

## 2015-03-07 NOTE — Progress Notes (Signed)
   Subjective:    Patient ID: [name], male    DOB: [birth year], 54 y.o.   MRN: [id number]  HPI Here today for f/u of his GERD. Here today for f/u of his chronic gerd.  He says he is doing good.  Appetite is good. He has gained 16 pounds since his last visit in May. His acid reflux is controlled with Protonix. He denies having any chest pain.  He had a cardiac work up last year for chest which was negative. Usually has a BM daily. No melen or BRRB. Seen last year with c/o left sided chest pain. His cardiac work up was negative. The pain lasted about 8 weeks. Pain possible musculoskeletal in nature.   03/08/2014 Stress test:Patient exercised on Bruce protocol for 12:00 reaching peak heart rate 169 BPM, 100% MPHR. Maximum workload 13.4 METS. Peak blood pressure 192/64 - hypertensive response. No worsening in mild, atypical chest pain at baseline. No diagnostic ST segment changes to indicate ischemia. No arrhythmias. Low risk Duke treadmill score of 12. Chest xray was normal.    2011: EGD: Scarring of vocal cords. One sessile gastric polyp in mid body not removed. By Dr. Collene Mares  12/19/2009 Per Dr Zella Richer notes 24 hr [place] study was negative for any significant acid reflux.   In April of 2010 he underwent a laproscopic Nissen fundalplication for GERD by Dr. Zella Richer 2011: EGD: Scarring of vocal cords. One sessile gastric polyp in mid body not removed. By Dr. Collene Mares   Review of Systems Married, two children in good health.  Works full time.     Objective:   Physical Exam Blood pressure 112/58, pulse 60, temperature 98 F (36.7 C), height 5\' 8"  (1.727 m), weight 215 lb 6.4 oz (97.705 kg). ear.     Alert and oriented. Skin warm and dry. Oral mucosa is moist.   . Sclera anicteric, conjunctivae is pink. Thyroid not enlarged. No cervical lymphadenopathy. Lungs clear. Heart regular rate and rhythm.  Abdomen is soft. Bowel sounds are positive. No hepatomegaly. No abdominal masses felt. No  tenderness.  No edema to lower extremities.         Assessment & Plan:  GERD, controlled at this time. No acid reflux. He has gained about 16 pounds since his last visit.  Needs to try to lose weight.  Continue the Protnix . OV in 1 yearl;

## 2015-06-14 ENCOUNTER — Ambulatory Visit (INDEPENDENT_AMBULATORY_CARE_PROVIDER_SITE_OTHER): Payer: 59 | Admitting: Internal Medicine

## 2015-07-25 ENCOUNTER — Ambulatory Visit (INDEPENDENT_AMBULATORY_CARE_PROVIDER_SITE_OTHER): Payer: 59 | Admitting: Internal Medicine

## 2015-07-25 ENCOUNTER — Encounter (INDEPENDENT_AMBULATORY_CARE_PROVIDER_SITE_OTHER): Payer: Self-pay | Admitting: Internal Medicine

## 2015-07-25 VITALS — BP 128/82 | HR 72 | Temp 98.0°F | Ht 68.5 in | Wt 201.4 lb

## 2015-07-25 DIAGNOSIS — R05 Cough: Secondary | ICD-10-CM

## 2015-07-25 DIAGNOSIS — R059 Cough, unspecified: Secondary | ICD-10-CM

## 2015-07-25 DIAGNOSIS — K219 Gastro-esophageal reflux disease without esophagitis: Secondary | ICD-10-CM

## 2015-07-25 MED ORDER — PANTOPRAZOLE SODIUM 40 MG PO TBEC: 40.0000 mg | DELAYED_RELEASE_TABLET | Freq: Two times a day (BID) | ORAL | Status: DC

## 2015-07-25 MED ORDER — LORATADINE-PSEUDOEPHEDRINE ER 5-120 MG PO TB12: 1.0000 | ORAL_TABLET | Freq: Two times a day (BID) | ORAL | Status: AC

## 2015-07-25 NOTE — Progress Notes (Signed)
Subjective:    Patient ID: [name], male    DOB: 1960/12/25, 54 y.o.   MRN: [id number]  HPI Here today for f/u. He was last seen in May for GERD. Presents today with c/o hoarseness since September 3. He says he had a virus.  He had a fever. He had a fever of 103.8.  After he felt better, he had hoarseness. He is hocking whitish color mucous. He say it looks chunky and thick. He says it is worse in the morning.  He has not tried any other medications. He is taking Protonix once a day.  He has had weight loss of about 14 pounds which was intentional.    03/08/2014 Stress test:Patient exercised on Bruce protocol for 12:00 reaching peak heart rate 169 BPM, 100% MPHR. Maximum workload 13.4 METS. Peak blood pressure 192/64 - hypertensive response. No worsening in mild, atypical chest pain at baseline. No diagnostic ST segment changes to indicate ischemia. No arrhythmias. Low risk Duke treadmill score of 12. Chest xray was normal.     2011: EGD:  Scarring of vocal cords. One sessile gastric polyp in mid body not removed. By Dr. Collene Mares  12/19/2009 Per Dr Zella Richer notes 24 hr [place] study was negative for any significant acid reflux.   In April of 2010 he underwent a laproscopic Nissen fundalplication for GERD by Dr. Zella Richer 2011: EGD:  Scarring of vocal cords. One sessile gastric polyp in mid body not removed. By Dr. Collene Mares   Review of Systems Past Medical History  Diagnosis Date  . GERD (gastroesophageal reflux disease)   . Hypertension   . Hypercholesteremia     Past Surgical History  Procedure Laterality Date  . Nissen fundoplication  32/3557  . Upper gastrointestinal endoscopy    . Colonoscopy  09/11/2012    Procedure: COLONOSCOPY;  Surgeon: Rogene Houston, MD;  Location: [facility];  Service: Endoscopy;  Laterality: N/A;  200    Allergies  Allergen Reactions  . Ciprofloxacin Other (See Comments)    Joint Pain  . Floxin [Ofloxacin] Other (See Comments)    Joint  pain    Current Outpatient Prescriptions on File Prior to Visit  Medication Sig Dispense Refill  . aspirin 81 MG tablet Take 81 mg by mouth daily.      Marland Kitchen dicyclomine (BENTYL) 10 MG capsule Take 10 mg by mouth as needed. Stomach Cramps    . ezetimibe (ZETIA) 10 MG tablet Take 10 mg by mouth daily.    . Multiple Vitamins-Minerals (CENTRUM SILVER PO) Take 1 tablet by mouth daily.     Marland Kitchen olmesartan-hydrochlorothiazide (BENICAR HCT) 20-12.5 MG per tablet Take 1 tablet by mouth daily.      . pantoprazole (PROTONIX) 40 MG tablet TAKE 1 TABLET BY MOUTH DAILY BEFORE BREAKFAST 90 tablet 3   No current facility-administered medications on file prior to visit.            Physical Exam Blood pressure 128/82, pulse 72, temperature 98 F (36.7 C), height 5' 8.5" (1.74 m), weight 201 lb 6.4 oz (91.354 kg). Alert and oriented. Skin warm and dry. Oral mucosa is moist.   . Sclera anicteric, conjunctivae is pink. Thyroid not enlarged. No cervical lymphadenopathy. Lungs clear. Heart regular rate and rhythm.  Abdomen is soft. Bowel sounds are positive. No hepatomegaly. No abdominal masses felt. No tenderness.  No edema to lower extremities.           Assessment & Plan:  Hoarseness, GERD. Protonix 40mg   BID x 2 weeks. Rx for Claritin. If not better in 2 weeks will refer to Dr. Luan Pulling. Will call with a PR in 2 weeks. I discussed this cae with Dr. Laural Golden.

## 2015-07-25 NOTE — Patient Instructions (Signed)
PR in 2 weeks

## 2015-08-09 ENCOUNTER — Telehealth (INDEPENDENT_AMBULATORY_CARE_PROVIDER_SITE_OTHER): Payer: Self-pay | Admitting: *Deleted

## 2015-08-09 NOTE — Telephone Encounter (Signed)
He is doing better. His return phone number is [phone].

## 2015-08-09 NOTE — Telephone Encounter (Signed)
noted 

## 2015-12-12 MED FILL — EZETIMIBE 10 MG TABLET: 10 | 30 days supply | Qty: 30 | Fill #6

## 2015-12-27 MED FILL — VIAGRA 100 MG TABLET: 100 | 30 days supply | Qty: 6 | Fill #2

## 2016-01-25 MED FILL — PANTOPRAZOLE SOD DR 40 MG T: 40 | 30 days supply | Qty: 60 | Fill #1

## 2016-01-27 MED FILL — VIAGRA 100 MG TABLET: 100 | 30 days supply | Qty: 6 | Fill #0

## 2016-02-01 MED FILL — EZETIMIBE 10 MG TABLET: 10 | 30 days supply | Qty: 30 | Fill #7

## 2016-03-22 DIAGNOSIS — K219 Gastro-esophageal reflux disease without esophagitis: Secondary | ICD-10-CM | POA: Diagnosis not present

## 2016-03-22 DIAGNOSIS — F419 Anxiety disorder, unspecified: Secondary | ICD-10-CM | POA: Diagnosis not present

## 2016-03-22 DIAGNOSIS — E785 Hyperlipidemia, unspecified: Secondary | ICD-10-CM | POA: Diagnosis not present

## 2016-03-22 DIAGNOSIS — I10 Essential (primary) hypertension: Secondary | ICD-10-CM | POA: Diagnosis not present

## 2016-03-22 DIAGNOSIS — N529 Male erectile dysfunction, unspecified: Secondary | ICD-10-CM | POA: Diagnosis not present

## 2016-03-27 MED FILL — DICYCLOMINE 10 MG CAPSULE: 10 | 90 days supply | Qty: 90 | Fill #0

## 2016-03-27 MED FILL — PANTOPRAZOLE SOD DR 40 MG T: 40 | 90 days supply | Qty: 90 | Fill #0

## 2016-03-27 MED FILL — EZETIMIBE 10 MG TABLET: 10 | 90 days supply | Qty: 90 | Fill #0

## 2016-03-27 MED FILL — OLMESARTAN-HCTZ 20-12.5 MG: 20-12.5 | 90 days supply | Qty: 90 | Fill #0

## 2016-03-27 MED FILL — VIAGRA 100 MG TABLET: 100 | 90 days supply | Qty: 18 | Fill #0

## 2016-04-07 DIAGNOSIS — G4700 Insomnia, unspecified: Secondary | ICD-10-CM | POA: Diagnosis not present

## 2016-04-07 DIAGNOSIS — I10 Essential (primary) hypertension: Secondary | ICD-10-CM | POA: Diagnosis not present

## 2016-04-07 DIAGNOSIS — K210 Gastro-esophageal reflux disease with esophagitis: Secondary | ICD-10-CM | POA: Diagnosis not present

## 2016-04-19 DIAGNOSIS — E785 Hyperlipidemia, unspecified: Secondary | ICD-10-CM | POA: Diagnosis not present

## 2016-04-19 DIAGNOSIS — I10 Essential (primary) hypertension: Secondary | ICD-10-CM | POA: Diagnosis not present

## 2016-04-23 MED FILL — ZOLPIDEM TARTRATE 10 MG TAB: 10 | 30 days supply | Qty: 30 | Fill #0

## 2016-04-23 MED FILL — FENOFIBRATE 50 MG CAPSULE: 50 | 30 days supply | Qty: 30 | Fill #0

## 2016-05-30 MED FILL — ZOLPIDEM TARTRATE 10 MG TAB: 10 | 30 days supply | Qty: 30 | Fill #1

## 2016-06-19 MED FILL — PANTOPRAZOLE SOD DR 40 MG T: 40 | 90 days supply | Qty: 90 | Fill #1

## 2016-06-19 MED FILL — EZETIMIBE 10 MG TABLET: 10 | 90 days supply | Qty: 90 | Fill #1

## 2016-06-19 MED FILL — VIAGRA 100 MG TABLET: 100 | 90 days supply | Qty: 18 | Fill #1

## 2016-06-19 MED FILL — OLMESARTAN-HCTZ 20-12.5 MG: 20-12.5 | 90 days supply | Qty: 90 | Fill #1

## 2016-09-10 MED FILL — EZETIMIBE 10 MG TABLET: 10 | 90 days supply | Qty: 90 | Fill #2

## 2016-09-10 MED FILL — VIAGRA 100 MG TABLET: 100 | 90 days supply | Qty: 18 | Fill #2

## 2016-09-10 MED FILL — PANTOPRAZOLE SOD DR 40 MG T: 40 | 90 days supply | Qty: 90 | Fill #2

## 2016-09-10 MED FILL — OLMESARTAN-HCTZ 20-12.5 MG: 20-12.5 | 90 days supply | Qty: 90 | Fill #2

## 2016-09-20 MED FILL — ZOLPIDEM TARTRATE 10 MG TAB: 10 | 30 days supply | Qty: 30 | Fill #2

## 2016-12-03 MED FILL — OLMESARTAN-HCTZ 20-12.5 MG: 20-12.5 | 90 days supply | Qty: 90 | Fill #3

## 2016-12-03 MED FILL — EZETIMIBE 10 MG TABLET: 10 | 90 days supply | Qty: 90 | Fill #3

## 2016-12-03 MED FILL — PANTOPRAZOLE SOD DR 40 MG T: 40 | 90 days supply | Qty: 90 | Fill #3

## 2016-12-04 MED FILL — SILDENAFIL 100 MG TABLET: 100 | 90 days supply | Qty: 18 | Fill #3

## 2016-12-17 DIAGNOSIS — H52221 Regular astigmatism, right eye: Secondary | ICD-10-CM | POA: Diagnosis not present

## 2016-12-17 DIAGNOSIS — H524 Presbyopia: Secondary | ICD-10-CM | POA: Diagnosis not present

## 2017-03-04 MED FILL — EZETIMIBE 10 MG TAB: 10 | 90 days supply | Qty: 90 | Fill #4

## 2017-03-04 MED FILL — OLMESARTAN-HCTZ 20-12.5 MG: 20-12.5 | 90 days supply | Qty: 90 | Fill #4

## 2017-03-04 MED FILL — PANTOPRAZOLE SOD DR 40 MG T: 40 | 90 days supply | Qty: 90 | Fill #4

## 2017-03-04 MED FILL — SILDENAFIL 100 MG TABLET: 100 | 90 days supply | Qty: 18 | Fill #4

## 2017-06-13 DIAGNOSIS — I10 Essential (primary) hypertension: Secondary | ICD-10-CM | POA: Diagnosis not present

## 2017-06-13 DIAGNOSIS — Z0000 Encounter for general adult medical examination without abnormal findings: Secondary | ICD-10-CM | POA: Diagnosis not present

## 2017-06-13 DIAGNOSIS — E785 Hyperlipidemia, unspecified: Secondary | ICD-10-CM | POA: Diagnosis not present

## 2017-06-20 DIAGNOSIS — N529 Male erectile dysfunction, unspecified: Secondary | ICD-10-CM | POA: Diagnosis not present

## 2017-06-20 DIAGNOSIS — K219 Gastro-esophageal reflux disease without esophagitis: Secondary | ICD-10-CM | POA: Diagnosis not present

## 2017-06-20 DIAGNOSIS — Z0001 Encounter for general adult medical examination with abnormal findings: Secondary | ICD-10-CM | POA: Diagnosis not present

## 2017-06-20 DIAGNOSIS — Z79899 Other long term (current) drug therapy: Secondary | ICD-10-CM | POA: Diagnosis not present

## 2017-06-20 DIAGNOSIS — E785 Hyperlipidemia, unspecified: Secondary | ICD-10-CM | POA: Diagnosis not present

## 2017-06-20 DIAGNOSIS — Z713 Dietary counseling and surveillance: Secondary | ICD-10-CM | POA: Diagnosis not present

## 2017-06-20 DIAGNOSIS — I10 Essential (primary) hypertension: Secondary | ICD-10-CM | POA: Diagnosis not present

## 2017-06-20 DIAGNOSIS — G4700 Insomnia, unspecified: Secondary | ICD-10-CM | POA: Diagnosis not present

## 2017-06-20 MED FILL — OLMESARTAN-HCTZ 20-12.5 MG: 20-12.5 | 90 days supply | Qty: 90 | Fill #0

## 2017-06-20 MED FILL — EZETIMIBE 10 MG TAB: 10 | 90 days supply | Qty: 90 | Fill #0

## 2017-06-20 MED FILL — SILDENAFIL CITRATE 100 MG T: 100 | 30 days supply | Qty: 6 | Fill #0

## 2017-06-20 MED FILL — PANTOPRAZOLE SOD DR 40 MG T: 40 | 90 days supply | Qty: 90 | Fill #0

## 2017-06-20 MED FILL — ZOLPIDEM TARTRATE 10 MG TAB: 10 | 30 days supply | Qty: 30 | Fill #0

## 2017-06-20 MED FILL — ATORVASTATIN 10 MG TABLET: 10 | 90 days supply | Qty: 90 | Fill #0

## 2017-09-07 DIAGNOSIS — I10 Essential (primary) hypertension: Secondary | ICD-10-CM | POA: Diagnosis not present

## 2017-09-07 DIAGNOSIS — E785 Hyperlipidemia, unspecified: Secondary | ICD-10-CM | POA: Diagnosis not present

## 2017-09-11 MED FILL — OLMESARTAN-HCTZ 20-12.5 MG: 20-12.5 | 90 days supply | Qty: 90 | Fill #1

## 2017-09-11 MED FILL — PANTOPRAZOLE SOD DR 40 MG T: 40 | 90 days supply | Qty: 90 | Fill #0

## 2017-09-19 DIAGNOSIS — I10 Essential (primary) hypertension: Secondary | ICD-10-CM | POA: Diagnosis not present

## 2017-09-19 DIAGNOSIS — Z713 Dietary counseling and surveillance: Secondary | ICD-10-CM | POA: Diagnosis not present

## 2017-09-19 DIAGNOSIS — Z79899 Other long term (current) drug therapy: Secondary | ICD-10-CM | POA: Diagnosis not present

## 2017-09-19 DIAGNOSIS — G4700 Insomnia, unspecified: Secondary | ICD-10-CM | POA: Diagnosis not present

## 2017-09-19 DIAGNOSIS — E785 Hyperlipidemia, unspecified: Secondary | ICD-10-CM | POA: Diagnosis not present

## 2017-09-19 MED FILL — ATORVASTATIN 10 MG TABLET: 10 | 90 days supply | Qty: 90 | Fill #0

## 2017-09-19 MED FILL — EZETIMIBE 10 MG TABS: 10 | 90 days supply | Qty: 90 | Fill #0

## 2017-12-09 MED FILL — SILDENAFIL CITRATE 100 MG T: 100 | 30 days supply | Qty: 6 | Fill #1

## 2017-12-09 MED FILL — OLMESARTAN-HCTZ 20-12.5 MG: 20-12.5 | 30 days supply | Qty: 30 | Fill #0

## 2017-12-09 MED FILL — EZETIMIBE 10 MG TABS: 10 | 90 days supply | Qty: 90 | Fill #1

## 2017-12-09 MED FILL — PANTOPRAZOLE SOD DR 40 MG T: 40 | 90 days supply | Qty: 90 | Fill #1

## 2018-01-20 MED FILL — SILDENAFIL CITRATE 100 MG T: 100 | 30 days supply | Qty: 6 | Fill #2

## 2018-01-20 MED FILL — OLMESARTAN-HCTZ 20-12.5 MG: 20-12.5 | 30 days supply | Qty: 30 | Fill #1

## 2018-02-19 MED FILL — OLMESARTAN-HCTZ 20-12.5 MG: 20-12.5 | 30 days supply | Qty: 30 | Fill #2

## 2018-03-04 MED FILL — SILDENAFIL CITRATE 100 MG T: 100 | 30 days supply | Qty: 6 | Fill #3

## 2018-03-19 MED FILL — EZETIMIBE 10 MG TABLET: 10 | 90 days supply | Qty: 90 | Fill #2

## 2018-03-19 MED FILL — PANTOPRAZOLE SOD DR 40 MG T: 40 | 90 days supply | Qty: 90 | Fill #2

## 2018-03-19 MED FILL — OLMESARTAN-HCTZ 20-12.5 MG: 20-12.5 | 30 days supply | Qty: 30 | Fill #3

## 2018-04-21 MED FILL — OLMESARTAN-HCTZ 20-12.5 MG: 20-12.5 | 30 days supply | Qty: 30 | Fill #4

## 2018-05-12 MED FILL — SILDENAFIL CITRATE 100 MG T: 100 | 30 days supply | Qty: 6 | Fill #4

## 2018-05-19 MED FILL — OLMESARTAN-HCTZ 20-12.5 MG: 20-12.5 | 30 days supply | Qty: 30 | Fill #5

## 2018-06-11 MED FILL — SILDENAFIL CITRATE 100 MG T: 100 | 30 days supply | Qty: 6 | Fill #5

## 2018-06-19 MED FILL — OLMESARTAN-HCTZ 20-12.5 MG: 20-12.5 | 30 days supply | Qty: 30 | Fill #6

## 2018-06-20 DIAGNOSIS — Z205 Contact with and (suspected) exposure to viral hepatitis: Secondary | ICD-10-CM | POA: Diagnosis not present

## 2018-06-20 DIAGNOSIS — Z0000 Encounter for general adult medical examination without abnormal findings: Secondary | ICD-10-CM | POA: Diagnosis not present

## 2018-06-20 DIAGNOSIS — N529 Male erectile dysfunction, unspecified: Secondary | ICD-10-CM | POA: Diagnosis not present

## 2018-06-20 DIAGNOSIS — I10 Essential (primary) hypertension: Secondary | ICD-10-CM | POA: Diagnosis not present

## 2018-06-20 DIAGNOSIS — Z131 Encounter for screening for diabetes mellitus: Secondary | ICD-10-CM | POA: Diagnosis not present

## 2018-06-26 DIAGNOSIS — I10 Essential (primary) hypertension: Secondary | ICD-10-CM | POA: Diagnosis not present

## 2018-06-26 DIAGNOSIS — G4700 Insomnia, unspecified: Secondary | ICD-10-CM | POA: Diagnosis not present

## 2018-06-26 DIAGNOSIS — K219 Gastro-esophageal reflux disease without esophagitis: Secondary | ICD-10-CM | POA: Diagnosis not present

## 2018-06-26 DIAGNOSIS — E785 Hyperlipidemia, unspecified: Secondary | ICD-10-CM | POA: Diagnosis not present

## 2018-06-26 DIAGNOSIS — Z79899 Other long term (current) drug therapy: Secondary | ICD-10-CM | POA: Diagnosis not present

## 2018-06-26 DIAGNOSIS — Z0000 Encounter for general adult medical examination without abnormal findings: Secondary | ICD-10-CM | POA: Diagnosis not present

## 2018-06-26 DIAGNOSIS — N529 Male erectile dysfunction, unspecified: Secondary | ICD-10-CM | POA: Diagnosis not present

## 2018-06-26 MED FILL — ZOLPIDEM TARTRATE 10 MG TAB: 10 | 30 days supply | Qty: 30 | Fill #0

## 2018-07-07 MED FILL — EZETIMIBE 10 MG TABLET: 10 | 90 days supply | Qty: 90 | Fill #3

## 2018-07-15 MED FILL — OLMESARTAN-HCTZ 20-12.5 MG: 20-12.5 | 30 days supply | Qty: 30 | Fill #7

## 2018-07-17 MED FILL — DICYCLOMINE 10 MG CAPSULE: 10 | 90 days supply | Qty: 90 | Fill #0

## 2018-07-29 MED FILL — SILDENAFIL CITRATE 100 MG T: 100 | 30 days supply | Qty: 6 | Fill #0

## 2018-07-29 MED FILL — PANTOPRAZOLE SOD DR 40 MG T: 40 | 90 days supply | Qty: 90 | Fill #0

## 2018-08-12 MED FILL — OLMESARTAN-HCTZ 20-12.5 MG: 20-12.5 | 30 days supply | Qty: 30 | Fill #8

## 2018-09-08 MED FILL — OLMESARTAN-HCTZ 20-12.5 MG: 20-12.5 | 30 days supply | Qty: 30 | Fill #9

## 2018-10-01 DIAGNOSIS — R1912 Hyperactive bowel sounds: Secondary | ICD-10-CM | POA: Diagnosis not present

## 2018-10-01 DIAGNOSIS — R197 Diarrhea, unspecified: Secondary | ICD-10-CM | POA: Diagnosis not present

## 2018-10-01 DIAGNOSIS — K219 Gastro-esophageal reflux disease without esophagitis: Secondary | ICD-10-CM | POA: Diagnosis not present

## 2018-10-07 MED FILL — SILDENAFIL CITRATE 100 MG T: 100 | 30 days supply | Qty: 6 | Fill #1

## 2018-10-09 MED FILL — EZETIMIBE 10 MG TABS: 10 | 90 days supply | Qty: 90 | Fill #0

## 2018-10-09 MED FILL — OLMESARTAN-HCTZ 20-12.5 MG: 20-12.5 | 90 days supply | Qty: 90 | Fill #0

## 2018-10-27 MED FILL — PANTOPRAZOLE SOD DR 40 MG T: 40 | 90 days supply | Qty: 90 | Fill #0

## 2018-10-29 ENCOUNTER — Encounter (INDEPENDENT_AMBULATORY_CARE_PROVIDER_SITE_OTHER): Payer: Self-pay | Admitting: Internal Medicine

## 2018-10-29 ENCOUNTER — Encounter (INDEPENDENT_AMBULATORY_CARE_PROVIDER_SITE_OTHER): Payer: Self-pay | Admitting: *Deleted

## 2018-10-29 ENCOUNTER — Ambulatory Visit (INDEPENDENT_AMBULATORY_CARE_PROVIDER_SITE_OTHER): Payer: 59 | Admitting: Internal Medicine

## 2018-10-29 VITALS — BP 130/80 | HR 72 | Temp 98.0°F | Ht 68.0 in | Wt 208.0 lb

## 2018-10-29 DIAGNOSIS — K2100 Gastro-esophageal reflux disease with esophagitis, without bleeding: Secondary | ICD-10-CM

## 2018-10-29 MED ORDER — PANTOPRAZOLE SODIUM 40 MG PO TBEC: 40.0000 mg | DELAYED_RELEASE_TABLET | Freq: Two times a day (BID) | ORAL | 3 refills | Status: DC

## 2018-10-29 NOTE — Progress Notes (Signed)
   Subjective:    Patient ID: [name], male    DOB: 1960-11-09, 59 y.o.   MRN: [id number] Referred by Dr Maudie Mercury.  HPI Here today with c/o GERD and hyperactive bowel sounds.  He states he has GERD and hyperactive bowel sounds. He says at times he has a sore throat. States he has burning in his throat.  He thinks he is refluxing up into his throat.  He says he has heartburn. BMs are normal.  His appetite is okay.  Hx of GERD in the past.  Last seen by me in May of 2016.    2011: EGD: Scarring of vocal cords. One sessile gastric polyp in mid body not removed. By Dr. Collene Mares 12/19/2009 Per Dr Zella Richer notes 24 hr [place] study was negative for any significant acid reflux.  In April of 2010 he underwent a laproscopic Nissen fundalplication for GERD by Dr. Zella Richer  Review of Systems Past Medical History:  Diagnosis Date  . GERD (gastroesophageal reflux disease)   . Hypercholesteremia   . Hypertension     Past Surgical History:  Procedure Laterality Date  . COLONOSCOPY  09/11/2012   Procedure: COLONOSCOPY;  Surgeon: Rogene Houston, MD;  Location: [facility];  Service: Endoscopy;  Laterality: N/A;  200  . NISSEN FUNDOPLICATION  42/7062  . UPPER GASTROINTESTINAL ENDOSCOPY      Allergies  Allergen Reactions  . Ciprofloxacin Other (See Comments)    Joint Pain  . Floxin [Ofloxacin] Other (See Comments)    Joint pain    Current Outpatient Medications on File Prior to Visit  Medication Sig Dispense Refill  . aspirin 81 MG tablet Take 81 mg by mouth daily.      Marland Kitchen dicyclomine (BENTYL) 10 MG capsule Take 10 mg by mouth as needed. Stomach Cramps    . ezetimibe (ZETIA) 10 MG tablet Take 10 mg by mouth daily.    Marland Kitchen loratadine-pseudoephedrine (CLARITIN-D 12 HOUR) 5-120 MG tablet Take 1 tablet by mouth 2 (two) times daily. 60 tablet 3  . Multiple Vitamins-Minerals (CENTRUM SILVER PO) Take 1 tablet by mouth daily.     Marland Kitchen olmesartan-hydrochlorothiazide (BENICAR HCT) 20-12.5 MG per  tablet Take 1 tablet by mouth daily.      . pantoprazole (PROTONIX) 40 MG tablet TAKE 1 TABLET BY MOUTH DAILY BEFORE BREAKFAST 90 tablet 3   No current facility-administered medications on file prior to visit.         Objective:   Physical Exam Blood pressure 130/80, pulse 72, temperature 98 F (36.7 C), height 5\' 8"  (1.727 m), weight 208 lb (94.3 kg). Alert and oriented. Skin warm and dry. Oral mucosa is moist.   . Sclera anicteric, conjunctivae is pink. Thyroid not enlarged. No cervical lymphadenopathy. Lungs clear. Heart regular rate and rhythm.  Abdomen is soft. Bowel sounds are positive. No hepatomegaly. No abdominal masses felt.           Assessment & Plan:  GERD. Continue the Protonix BID Take the [place] BID. EGD to be sure his wrap is in place The risks of bleeding, perforation and infection were reviewed with patient.

## 2018-10-29 NOTE — Patient Instructions (Addendum)
The risks of bleeding, perforation and infection were reviewed with patient.  Take Bentyl twice a day

## 2018-10-30 DIAGNOSIS — K2100 Gastro-esophageal reflux disease with esophagitis, without bleeding: Secondary | ICD-10-CM | POA: Insufficient documentation

## 2018-10-31 MED FILL — DICYCLOMINE 10 MG CAPSULE: 10 | 90 days supply | Qty: 90 | Fill #1

## 2018-12-01 ENCOUNTER — Ambulatory Visit (HOSPITAL_COMMUNITY)
Admission: RE | Admit: 2018-12-01 | Discharge: 2018-12-01 | Disposition: A | Discharge status: Home / Self Care | Payer: 59 | Attending: Internal Medicine | Admitting: Internal Medicine

## 2018-12-01 ENCOUNTER — Other Ambulatory Visit: Payer: Self-pay

## 2018-12-01 ENCOUNTER — Encounter (HOSPITAL_COMMUNITY): Payer: Self-pay | Admitting: *Deleted

## 2018-12-01 ENCOUNTER — Encounter (HOSPITAL_COMMUNITY)
Admission: RE | Disposition: A | Discharge status: Home / Self Care | Payer: Self-pay | Source: Home / Self Care | Attending: Internal Medicine

## 2018-12-01 DIAGNOSIS — K2100 Gastro-esophageal reflux disease with esophagitis, without bleeding: Secondary | ICD-10-CM | POA: Insufficient documentation

## 2018-12-01 DIAGNOSIS — Z9889 Other specified postprocedural states: Secondary | ICD-10-CM | POA: Insufficient documentation

## 2018-12-01 DIAGNOSIS — E7800 Pure hypercholesterolemia, unspecified: Secondary | ICD-10-CM | POA: Diagnosis not present

## 2018-12-01 DIAGNOSIS — Z7982 Long term (current) use of aspirin: Secondary | ICD-10-CM | POA: Insufficient documentation

## 2018-12-01 DIAGNOSIS — Z79899 Other long term (current) drug therapy: Secondary | ICD-10-CM | POA: Insufficient documentation

## 2018-12-01 DIAGNOSIS — K228 Other specified diseases of esophagus: Secondary | ICD-10-CM

## 2018-12-01 DIAGNOSIS — K2270 Barrett's esophagus without dysplasia: Secondary | ICD-10-CM

## 2018-12-01 DIAGNOSIS — K219 Gastro-esophageal reflux disease without esophagitis: Secondary | ICD-10-CM

## 2018-12-01 DIAGNOSIS — Z8249 Family history of ischemic heart disease and other diseases of the circulatory system: Secondary | ICD-10-CM | POA: Diagnosis not present

## 2018-12-01 DIAGNOSIS — I10 Essential (primary) hypertension: Secondary | ICD-10-CM | POA: Insufficient documentation

## 2018-12-01 DIAGNOSIS — Z881 Allergy status to other antibiotic agents status: Secondary | ICD-10-CM | POA: Diagnosis not present

## 2018-12-01 HISTORY — PX: BIOPSY: SHX5522

## 2018-12-01 HISTORY — DX: Barrett's esophagus without dysplasia: K22.70

## 2018-12-01 HISTORY — PX: ESOPHAGOGASTRODUODENOSCOPY: SHX5428

## 2018-12-01 SURGERY — EGD (ESOPHAGOGASTRODUODENOSCOPY)
Anesthesia: Moderate Sedation

## 2018-12-01 MED ORDER — MEPERIDINE HCL 50 MG/ML IJ SOLN
INTRAMUSCULAR | Status: AC
  Filled 2018-12-01: qty 1

## 2018-12-01 MED ORDER — SODIUM CHLORIDE 0.9 % IV SOLN
INTRAVENOUS | Status: DC
  Administered 2018-12-01: 08:00:00 via INTRAVENOUS

## 2018-12-01 MED ORDER — MEPERIDINE HCL 50 MG/ML IJ SOLN
INTRAMUSCULAR | Status: DC | PRN
  Administered 2018-12-01 (×2): 25 mg

## 2018-12-01 MED ORDER — MIDAZOLAM HCL 5 MG/5ML IJ SOLN
INTRAMUSCULAR | Status: AC
  Filled 2018-12-01: qty 10

## 2018-12-01 MED ORDER — MIDAZOLAM HCL 5 MG/5ML IJ SOLN
INTRAMUSCULAR | Status: DC | PRN
  Administered 2018-12-01 (×2): 2 mg via INTRAVENOUS
  Administered 2018-12-01 (×2): 1 mg via INTRAVENOUS
  Administered 2018-12-01: 2 mg via INTRAVENOUS

## 2018-12-01 MED ORDER — LIDOCAINE VISCOUS HCL 2 % MT SOLN
OROMUCOSAL | Status: DC | PRN
  Administered 2018-12-01: 1 via OROMUCOSAL

## 2018-12-01 MED ORDER — SIMETHICONE 40 MG/0.6ML PO SUSP
ORAL | Status: DC | PRN
  Administered 2018-12-01: 09:00:00

## 2018-12-01 MED ORDER — LIDOCAINE VISCOUS HCL 2 % MT SOLN
OROMUCOSAL | Status: AC
  Filled 2018-12-01: qty 15

## 2018-12-01 NOTE — H&P (Signed)
[name] is an 58 y.o. male.   Chief Complaint: Patient is here for EGD. HPI: Patient is 58 year old Caucasian male who has had GERD since he was in his mid 74s.  About 8 years ago he had antireflux surgery since he was not responded to medical therapy.  Patient had to go back on a PPI within a year or 2 of his surgery.  He was seen in the office recently for recurrent heartburn and sore throat.  PPI dose has been doubled and he is feeling better.  He was also felt to have IBS and begun on dicyclomine.  He denies nausea vomiting dysphagia abdominal pain melena or rectal bleeding.  He states he is watching his diet.  He does not smoke cigarettes or drink alcohol.  Past Medical History:  Diagnosis Date  . GERD (gastroesophageal reflux disease)   . Hypercholesteremia   . Hypertension     Past Surgical History:  Procedure Laterality Date  . COLONOSCOPY  09/11/2012   Procedure: COLONOSCOPY;  Surgeon: Rogene Houston, MD;  Location: [facility];  Service: Endoscopy;  Laterality: N/A;  200  . NISSEN FUNDOPLICATION  82/5053  . UPPER GASTROINTESTINAL ENDOSCOPY      Family History  Problem Relation Age of Onset  . Diabetes Mother   . Heart disease Father   . Diabetes Brother   . Healthy Brother   . Healthy Daughter   . Healthy Son   . Colon cancer Neg Hx    Social History:  reports that he has never smoked. He has never used smokeless tobacco. He reports current alcohol use of about 12.0 standard drinks of alcohol per week. He reports that he does not use drugs.  Allergies:  Allergies  Allergen Reactions  . Ciprofloxacin Other (See Comments)    Joint Pain  . Floxin [Ofloxacin] Other (See Comments)    Joint pain    Medications Prior to Admission  Medication Sig Dispense Refill  . aspirin 81 MG tablet Take 81 mg by mouth every evening.     . dicyclomine (BENTYL) 10 MG capsule Take 10 mg by mouth 2 (two) times daily as needed (stomach cramps).     . ezetimibe (ZETIA) 10 MG  tablet Take 10 mg by mouth daily.    Marland Kitchen loratadine-pseudoephedrine (CLARITIN-D 12 HOUR) 5-120 MG tablet Take 1 tablet by mouth 2 (two) times daily. (Patient taking differently: Take 1 tablet by mouth 2 (two) times daily as needed for allergies. ) 60 tablet 3  . Multiple Vitamins-Minerals (CENTRUM SILVER PO) Take 1 tablet by mouth daily.     Marland Kitchen olmesartan-hydrochlorothiazide (BENICAR HCT) 20-12.5 MG per tablet Take 1 tablet by mouth daily.      . pantoprazole (PROTONIX) 40 MG tablet Take 1 tablet (40 mg total) by mouth 2 (two) times daily before a meal. 180 tablet 3  . Polyethyl Glycol-Propyl Glycol (LUBRICANT EYE DROPS) 0.4-0.3 % SOLN Place 1 drop into both eyes 3 (three) times daily as needed (dry/irritated eyes.).    Marland Kitchen sildenafil (VIAGRA) 100 MG tablet Take 100 mg by mouth daily as needed for erectile dysfunction.      No results found for this or any previous visit (from the past 48 hour(s)). No results found.  ROS  Blood pressure (!) 150/90, pulse 76, temperature 97.7 F (36.5 C), temperature source Oral, resp. rate 17, SpO2 97 %. Physical Exam  Constitutional: He appears well-developed and well-nourished.  HENT:  Mouth/Throat: Oropharynx is clear and moist.  Eyes:  Conjunctivae are normal. No scleral icterus.  Neck: No thyromegaly present.  Cardiovascular: Normal rate, regular rhythm and normal heart sounds.  No murmur heard. Respiratory: Effort normal and breath sounds normal.  GI: Soft. He exhibits no distension and no mass. There is no abdominal tenderness.  Musculoskeletal:        General: No edema.  Lymphadenopathy:    He has no cervical adenopathy.  Neurological: He is alert.  Skin: Skin is warm.     Assessment/Plan Chronic GERD with relapse of symptoms while on therapy. Status post antireflux surgery 8 years ago. Diagnostic EGD.  Hildred Laser, MD 12/01/2018, 8:43 AM

## 2018-12-01 NOTE — Discharge Instructions (Signed)
Resume aspirin on 12/02/2018. Resume other medications diet as before. No driving for 24 hours. Physician will call with biopsy results. Office visit in 3 months(with Dr. Laural Golden.)   Upper Endoscopy, Adult, Care After This sheet gives you information about how to care for yourself after your procedure. Your health care provider may also give you more specific instructions. If you have problems or questions, contact your health care provider. What can I expect after the procedure? After the procedure, it is common to have:  A sore throat.  Mild stomach pain or discomfort.  Bloating.  Nausea. Follow these instructions at home:   Follow instructions from your health care provider about what to eat or drink after your procedure.  Return to your normal activities as told by your health care provider. Ask your health care provider what activities are safe for you.  Take over-the-counter and prescription medicines only as told by your health care provider.  Do not drive for 24 hours if you were given a sedative during your procedure.  Keep all follow-up visits as told by your health care provider. This is important. Contact a health care provider if you have:  A sore throat that lasts longer than one day.  Trouble swallowing. Get help right away if:  You vomit blood or your vomit looks like coffee grounds.  You have: ? A fever. ? Bloody, black, or tarry stools. ? A severe sore throat or you cannot swallow. ? Difficulty breathing. ? Severe pain in your chest or abdomen. Summary  After the procedure, it is common to have a sore throat, mild stomach discomfort, bloating, and nausea.  Do not drive for 24 hours if you were given a sedative during the procedure.  Follow instructions from your health care provider about what to eat or drink after your procedure.  Return to your normal activities as told by your health care provider. This information is not intended to replace  advice given to you by your health care provider. Make sure you discuss any questions you have with your health care provider. Document Released: 04/08/2012 Document Revised: 03/10/2018 Document Reviewed: 03/10/2018 Elsevier Interactive Patient Education  2019 [employer].

## 2018-12-01 NOTE — Op Note (Signed)
[facility] Patient Name: [name] Procedure Date: 12/01/2018 8:42 AM MRN: [id number] Date of Birth: [birth year] Attending MD: Hildred Laser , MD CSN: [id number] Age: 58 Admit Type: Outpatient Procedure:                Upper GI endoscopy Indications:              Follow-up of gastro-esophageal reflux disease Providers:                Hildred Laser, MD, Gerome Sam, RN, Randa Spike, Technician Referring MD:             Jani Gravel, MD Medicines:                Lidocaine spray, Meperidine 50 mg IV, Midazolam 8                            mg IV Complications:            No immediate complications. Estimated Blood Loss:     Estimated blood loss was minimal. Procedure:                Pre-Anesthesia Assessment:                           - Prior to the procedure, a History and Physical                            was performed, and patient medications and                            allergies were reviewed. The patient's tolerance of                            previous anesthesia was also reviewed. The risks                            and benefits of the procedure and the sedation                            options and risks were discussed with the patient.                            All questions were answered, and informed consent                            was obtained. Prior Anticoagulants: The patient                            last took aspirin 3 days prior to the procedure.                            ASA Grade Assessment: II - A patient with mild  systemic disease. After reviewing the risks and                            benefits, the patient was deemed in satisfactory                            condition to undergo the procedure.                           After obtaining informed consent, the endoscope was                            passed under direct vision. Throughout the                            procedure, the  patient's blood pressure, pulse, and                            oxygen saturations were monitored continuously. The                            [id number]-H190 ([id number]) scope was introduced through the                            mouth, and advanced to the second part of duodenum.                            The upper GI endoscopy was accomplished without                            difficulty. The patient tolerated the procedure                            well. Scope In: 8:56:57 AM Scope Out: 9:06:11 AM Total Procedure Duration: 0 hours 9 minutes 14 seconds  Findings:      The examined esophagus was normal.      The Z-line was irregular and was found 39 cm from the incisors. Biopsies       were taken with a cold forceps for histology.      Evidence of a 607 degree fundoplication was found in the cardia. The       wrap appeared loose.      The exam of the stomach was otherwise normal.      The duodenal bulb and second portion of the duodenum were normal. Impression:               - Normal esophagus.                           - Z-line irregular, 39 cm from the incisors.                            Biopsied.                           - A 371 degree fundoplication was  found. The wrap                            appears loose.                           - Normal duodenal bulb and second portion of the                            duodenum. Moderate Sedation:      Moderate (conscious) sedation was administered by the endoscopy nurse       and supervised by the endoscopist. The following parameters were       monitored: oxygen saturation, heart rate, blood pressure, CO2       capnography and response to care. Total physician intraservice time was       16 minutes. Recommendation:           - Patient has a contact number available for                            emergencies. The signs and symptoms of potential                            delayed complications were discussed with the                             patient. Return to normal activities tomorrow.                            Written discharge instructions were provided to the                            patient.                           - Resume previous diet today.                           - Continue present medications.                           - No aspirin, ibuprofen, naproxen, or other                            non-steroidal anti-inflammatory drugs for 1 day.                           - Await pathology results.                           - Return to GI clinic in 3 months. Procedure Code(s):        --- Professional ---                           [phone], Esophagogastroduodenoscopy, flexible,  transoral; with biopsy, single or multiple                           G0500, Moderate sedation services provided by the                            same physician or other qualified health care                            professional performing a gastrointestinal                            endoscopic service that sedation supports,                            requiring the presence of an independent trained                            observer to assist in the monitoring of the                            patient's level of consciousness and physiological                            status; initial 15 minutes of intra-service time;                            patient age 27 years or older (additional time may                            be reported with [id number], as appropriate) Diagnosis Code(s):        --- Professional ---                           K22.8, Other specified diseases of esophagus                           Z98.890, Other specified postprocedural states                           K21.9, Gastro-esophageal reflux disease without                            esophagitis CPT copyright 2018 [employer] Association. All rights reserved. The codes documented in this report are preliminary and upon coder review may  be  revised to meet current compliance requirements. Hildred Laser, MD Hildred Laser, MD 12/01/2018 9:16:43 AM This report has been signed electronically. Number of Addenda: 0

## 2018-12-05 ENCOUNTER — Encounter (HOSPITAL_COMMUNITY): Payer: Self-pay | Admitting: Internal Medicine

## 2018-12-08 MED FILL — PANTOPRAZOLE SOD DR 40 MG T: 40 | 90 days supply | Qty: 180 | Fill #0

## 2019-01-03 MED FILL — OLMESARTAN-HCTZ 20-12.5 MG: 20-12.5 | 90 days supply | Qty: 90 | Fill #1

## 2019-01-03 MED FILL — EZETIMIBE 10 MG TABS: 10 | 90 days supply | Qty: 90 | Fill #1

## 2019-01-10 MED FILL — DICYCLOMINE 10 MG CAPSULE: 10 | 90 days supply | Qty: 90 | Fill #2

## 2019-01-21 MED FILL — SILDENAFIL CITRATE 100 MG T: 100 | 30 days supply | Qty: 6 | Fill #0

## 2019-02-01 ENCOUNTER — Encounter: Payer: Self-pay | Admitting: Internal Medicine

## 2019-02-17 DIAGNOSIS — Z79899 Other long term (current) drug therapy: Secondary | ICD-10-CM | POA: Diagnosis not present

## 2019-02-17 DIAGNOSIS — I10 Essential (primary) hypertension: Secondary | ICD-10-CM | POA: Diagnosis not present

## 2019-02-17 DIAGNOSIS — E785 Hyperlipidemia, unspecified: Secondary | ICD-10-CM | POA: Diagnosis not present

## 2019-02-17 DIAGNOSIS — G4700 Insomnia, unspecified: Secondary | ICD-10-CM | POA: Diagnosis not present

## 2019-02-17 DIAGNOSIS — K219 Gastro-esophageal reflux disease without esophagitis: Secondary | ICD-10-CM | POA: Diagnosis not present

## 2019-03-03 ENCOUNTER — Ambulatory Visit (INDEPENDENT_AMBULATORY_CARE_PROVIDER_SITE_OTHER): Payer: 59 | Admitting: Internal Medicine

## 2019-03-05 ENCOUNTER — Ambulatory Visit (INDEPENDENT_AMBULATORY_CARE_PROVIDER_SITE_OTHER): Payer: 59 | Admitting: Internal Medicine

## 2019-03-06 MED FILL — PANTOPRAZOLE SOD DR 40 MG T: 40 | 90 days supply | Qty: 180 | Fill #0

## 2019-03-06 MED FILL — SILDENAFIL CITRATE 100 MG T: 100 | 30 days supply | Qty: 6 | Fill #1

## 2019-04-02 MED FILL — DICYCLOMINE 10 MG CAPSULE: 10 | 90 days supply | Qty: 90 | Fill #3

## 2019-04-02 MED FILL — SILDENAFIL CITRATE 100 MG T: 100 | 30 days supply | Qty: 6 | Fill #0

## 2019-04-06 MED FILL — OLMESARTAN-HCTZ 20-12.5 MG: 20-12.5 | 90 days supply | Qty: 90 | Fill #0

## 2019-04-06 MED FILL — EZETIMIBE 10 MG TABS: 10 | 90 days supply | Qty: 90 | Fill #0

## 2019-05-19 ENCOUNTER — Other Ambulatory Visit: Payer: Self-pay

## 2019-05-19 ENCOUNTER — Ambulatory Visit (INDEPENDENT_AMBULATORY_CARE_PROVIDER_SITE_OTHER): Payer: 59 | Admitting: Internal Medicine

## 2019-05-19 ENCOUNTER — Encounter (INDEPENDENT_AMBULATORY_CARE_PROVIDER_SITE_OTHER): Payer: Self-pay | Admitting: Internal Medicine

## 2019-05-19 VITALS — BP 131/86 | HR 79 | Temp 98.4°F | Resp 18 | Ht 68.0 in | Wt 205.3 lb

## 2019-05-19 DIAGNOSIS — K219 Gastro-esophageal reflux disease without esophagitis: Secondary | ICD-10-CM | POA: Diagnosis not present

## 2019-05-19 DIAGNOSIS — K580 Irritable bowel syndrome with diarrhea: Secondary | ICD-10-CM | POA: Diagnosis not present

## 2019-05-19 DIAGNOSIS — K589 Irritable bowel syndrome without diarrhea: Secondary | ICD-10-CM | POA: Insufficient documentation

## 2019-05-19 MED ORDER — DICYCLOMINE HCL 10 MG PO CAPS: 10.0000 mg | ORAL_CAPSULE | Freq: Two times a day (BID) | ORAL | 1 refills | Status: DC | PRN

## 2019-05-19 MED ORDER — FAMOTIDINE 20 MG PO TABS: 20.0000 mg | ORAL_TABLET | Freq: Every day | ORAL | Status: AC

## 2019-05-19 MED ORDER — ESOMEPRAZOLE MAGNESIUM 40 MG PO CPDR: 40.0000 mg | DELAYED_RELEASE_CAPSULE | Freq: Every day | ORAL | 1 refills | Status: DC

## 2019-05-19 MED FILL — ESOMEPRAZOLE MAG DR 40 MG C: 40 | 90 days supply | Qty: 90 | Fill #0

## 2019-05-19 NOTE — Patient Instructions (Signed)
Please call office with progress report in one month.

## 2019-05-19 NOTE — Progress Notes (Signed)
Presenting complaint;  Follow-up for GERD and IBS.  Database and subjective:  [name] is 58 year old Caucasian male who has several year history of GERD with poor control with medications who went on to have antireflux surgery 10 years ago who was back on PPI therapy within 2 years or so.  He was seen few months back for intermittent hoarseness and coughing as well as burning sensation in mouth despite being on pantoprazole twice daily. He underwent EGD on 12/01/2018.  He did not have evidence of erosive esophagitis.  GE junction was wavy.  Biopsy was taken and revealed Barrett's esophagus without dysplasia.  Patient was advised to continue therapy and dietary measures.  He was supposed to come earlier but appointment was changed because of ongoing COVID-19 pandemic.  He says he cannot tell any difference.  He has been on pantoprazole twice daily for more than 6 months.  He is watching his diet.  He eats his evening meal at least 4 hours before he goes to bed.  He stays away from fatty and fried foods as well as caffeine-containing products.  He remains with intermittent hoarseness cough and at times has burning sensation in his mouth.  He is not having any heartburn or regurgitation.  He also denies dysphagia.  He remains with intermittent diarrhea and urgency.  He is on dicyclomine 10 mg daily.  He feels he needs to take it more frequently. His appetite is good and his weight has been stable.    Current Medications: Outpatient Encounter Medications as of 05/19/2019  Medication Sig  . aspirin 81 MG tablet Take 1 tablet (81 mg total) by mouth every evening.  . dicyclomine (BENTYL) 10 MG capsule Take 10 mg by mouth 2 (two) times daily as needed (stomach cramps).   . ezetimibe (ZETIA) 10 MG tablet Take 10 mg by mouth daily.  Marland Kitchen loratadine-pseudoephedrine (CLARITIN-D 12 HOUR) 5-120 MG tablet Take 1 tablet by mouth 2 (two) times daily. (Patient taking differently: Take 1 tablet by mouth 2 (two) times  daily as needed for allergies. )  . olmesartan-hydrochlorothiazide (BENICAR HCT) 20-12.5 MG per tablet Take 1 tablet by mouth daily.    . pantoprazole (PROTONIX) 40 MG tablet Take 1 tablet (40 mg total) by mouth 2 (two) times daily before a meal.  . Polyethyl Glycol-Propyl Glycol (LUBRICANT EYE DROPS) 0.4-0.3 % SOLN Place 1 drop into both eyes 3 (three) times daily as needed (dry/irritated eyes.).  Marland Kitchen sildenafil (VIAGRA) 100 MG tablet Take 100 mg by mouth daily as needed for erectile dysfunction.  . Multiple Vitamins-Minerals (CENTRUM SILVER PO) Take 1 tablet by mouth daily.    No facility-administered encounter medications on file as of 05/19/2019.      Objective: Blood pressure 131/86, pulse 79, temperature 98.4 F (36.9 C), temperature source Oral, resp. rate 18, height 5\' 8"  (1.727 m), weight 205 lb 4.8 oz (93.1 kg). Patient is alert and in no acute distress. He is wearing a mask. Conjunctiva is pink. Sclera is nonicteric Oropharyngeal mucosa is normal. No neck masses or thyromegaly noted. Cardiac exam with regular rhythm normal S1 and S2. No murmur or gallop noted. Lungs are clear to auscultation. Abdomen is full but soft and nontender with organomegaly or masses. No LE edema or clubbing noted.  Assessment:  #1.  Chronic GERD.  He has been well documented to have laryngitis secondary to GERD in addition to typical symptoms.  Heartburn is well controlled with therapy but throat symptoms are not.  He has short segment  Barrett's esophagus without dysplasia.  He has tried various PPIs in the past and Nexium/esomeprazole seem to have helped him the most.  He has not used dexlansoprazole or rebaprazole.  #2.  IBS.  He has intermittent diarrhea and urgency.  Plan:  Discontinue pantoprazole. Begin esomeprazole 40 mg by mouth 30 minutes before breakfast daily.  Prescription given for 90 doses with 1 refill. Famotidine OTC 20 mg by mouth daily at bedtime. New prescription given for  dicyclomine 10 mg p.o. twice daily as needed 180 with 1 refill. Patient will call with progress report in 1 month and return for office visit in 1 year.

## 2019-06-26 MED FILL — OLMESARTAN-HCTZ 20-12.5 MG: 20-12.5 | 90 days supply | Qty: 90 | Fill #1

## 2019-06-26 MED FILL — SILDENAFIL CITRATE 100 MG T: 100 | 30 days supply | Qty: 6 | Fill #1

## 2019-06-26 MED FILL — EZETIMIBE 10 MG TABS: 10 | 90 days supply | Qty: 90 | Fill #1

## 2019-06-30 DIAGNOSIS — Z0001 Encounter for general adult medical examination with abnormal findings: Secondary | ICD-10-CM | POA: Diagnosis not present

## 2019-06-30 DIAGNOSIS — E785 Hyperlipidemia, unspecified: Secondary | ICD-10-CM | POA: Diagnosis not present

## 2019-06-30 DIAGNOSIS — Z79899 Other long term (current) drug therapy: Secondary | ICD-10-CM | POA: Diagnosis not present

## 2019-06-30 DIAGNOSIS — I10 Essential (primary) hypertension: Secondary | ICD-10-CM | POA: Diagnosis not present

## 2019-07-02 DIAGNOSIS — Z0000 Encounter for general adult medical examination without abnormal findings: Secondary | ICD-10-CM | POA: Diagnosis not present

## 2019-07-02 DIAGNOSIS — G4700 Insomnia, unspecified: Secondary | ICD-10-CM | POA: Diagnosis not present

## 2019-07-02 DIAGNOSIS — N529 Male erectile dysfunction, unspecified: Secondary | ICD-10-CM | POA: Diagnosis not present

## 2019-07-02 DIAGNOSIS — I10 Essential (primary) hypertension: Secondary | ICD-10-CM | POA: Diagnosis not present

## 2019-07-02 DIAGNOSIS — E785 Hyperlipidemia, unspecified: Secondary | ICD-10-CM | POA: Diagnosis not present

## 2019-07-02 MED FILL — ZOLPIDEM TARTRATE 10 MG TAB: 10 | 90 days supply | Qty: 90 | Fill #0

## 2019-07-08 ENCOUNTER — Telehealth (INDEPENDENT_AMBULATORY_CARE_PROVIDER_SITE_OTHER): Payer: Self-pay | Admitting: Internal Medicine

## 2019-07-08 NOTE — Telephone Encounter (Signed)
Patients wife called with progress report - states patient is still taking Nexium is feeling some better but not a lot

## 2019-07-08 NOTE — Telephone Encounter (Signed)
Dr.Rehman this is a progress report from the patient. Also , the plan from his recent office visit.

## 2019-07-30 MED FILL — ZOLPIDEM TARTRATE 10 MG TAB: 10 | 90 days supply | Qty: 90 | Fill #0

## 2019-07-30 MED FILL — DICYCLOMINE 10 MG CAPSULE: 10 | 90 days supply | Qty: 180 | Fill #0

## 2019-08-04 MED FILL — ESOMEPRAZOLE MAG DR 40 MG C: 40 | 90 days supply | Qty: 90 | Fill #1

## 2019-08-04 MED FILL — SILDENAFIL CITRATE 100 MG T: 100 | 30 days supply | Qty: 6 | Fill #0

## 2019-09-21 MED FILL — OLMESARTAN-HCTZ 20-12.5 MG: 20-12.5 | 90 days supply | Qty: 90 | Fill #0

## 2019-09-21 MED FILL — EZETIMIBE 10 MG TABS: 10 | 90 days supply | Qty: 90 | Fill #0

## 2019-11-02 ENCOUNTER — Other Ambulatory Visit (INDEPENDENT_AMBULATORY_CARE_PROVIDER_SITE_OTHER): Payer: Self-pay | Admitting: Internal Medicine

## 2019-11-02 MED FILL — DICYCLOMINE 10 MG CAPSULE: 10 | 90 days supply | Qty: 180 | Fill #1

## 2019-11-02 MED FILL — ZOLPIDEM TARTRATE 10 MG TAB: 10 | 90 days supply | Qty: 90 | Fill #1

## 2019-11-02 MED FILL — SILDENAFIL CITRATE 100 MG T: 100 | 30 days supply | Qty: 6 | Fill #1

## 2019-11-02 MED FILL — ESOMEPRAZOLE MAG DR 40 MG C: 40 | 90 days supply | Qty: 90 | Fill #0

## 2019-11-17 DIAGNOSIS — J329 Chronic sinusitis, unspecified: Secondary | ICD-10-CM | POA: Diagnosis not present

## 2019-12-21 MED FILL — SILDENAFIL CITRATE 100 MG T: 100 | 30 days supply | Qty: 6 | Fill #2

## 2019-12-23 MED FILL — OLMESARTAN-HCTZ 20-12.5 MG: 20-12.5 | 90 days supply | Qty: 90 | Fill #1

## 2019-12-28 MED FILL — EZETIMIBE 10 MG TABS: 10 | 90 days supply | Qty: 90 | Fill #1

## 2019-12-30 DIAGNOSIS — I10 Essential (primary) hypertension: Secondary | ICD-10-CM | POA: Diagnosis not present

## 2019-12-30 DIAGNOSIS — E785 Hyperlipidemia, unspecified: Secondary | ICD-10-CM | POA: Diagnosis not present

## 2019-12-31 ENCOUNTER — Other Ambulatory Visit (HOSPITAL_COMMUNITY): Payer: Self-pay | Admitting: Adult Health

## 2019-12-31 DIAGNOSIS — I10 Essential (primary) hypertension: Secondary | ICD-10-CM | POA: Diagnosis not present

## 2019-12-31 DIAGNOSIS — E785 Hyperlipidemia, unspecified: Secondary | ICD-10-CM | POA: Diagnosis not present

## 2019-12-31 DIAGNOSIS — G4700 Insomnia, unspecified: Secondary | ICD-10-CM | POA: Diagnosis not present

## 2019-12-31 DIAGNOSIS — N529 Male erectile dysfunction, unspecified: Secondary | ICD-10-CM | POA: Diagnosis not present

## 2020-02-01 MED FILL — ESOMEPRAZOLE MAG DR 40 MG C: 40 | 90 days supply | Qty: 90 | Fill #1

## 2020-03-10 MED FILL — SILDENAFIL CITRATE 100 MG T: 100 | 30 days supply | Qty: 6 | Fill #3

## 2020-03-16 MED FILL — EZETIMIBE 10 MG TABS: 10 | 90 days supply | Qty: 90 | Fill #0

## 2020-03-16 MED FILL — OLMESARTAN-HCTZ 20-12.5 MG: 20-12.5 | 90 days supply | Qty: 90 | Fill #0

## 2020-04-29 ENCOUNTER — Other Ambulatory Visit (INDEPENDENT_AMBULATORY_CARE_PROVIDER_SITE_OTHER): Payer: Self-pay | Admitting: Gastroenterology

## 2020-04-29 MED FILL — SILDENAFIL CITRATE 100 MG T: 100 | 30 days supply | Qty: 6 | Fill #4

## 2020-04-29 MED FILL — ESOMEPRAZOLE MAG DR 40 MG C: 40 | 90 days supply | Qty: 90 | Fill #0

## 2020-04-29 MED FILL — ZOLPIDEM TARTRATE 10 MG TAB: 10 | 90 days supply | Qty: 90 | Fill #0

## 2020-05-05 DIAGNOSIS — H1132 Conjunctival hemorrhage, left eye: Secondary | ICD-10-CM | POA: Diagnosis not present

## 2020-05-18 DIAGNOSIS — F5221 Male erectile disorder: Secondary | ICD-10-CM | POA: Diagnosis not present

## 2020-05-18 DIAGNOSIS — K219 Gastro-esophageal reflux disease without esophagitis: Secondary | ICD-10-CM | POA: Diagnosis not present

## 2020-05-18 DIAGNOSIS — R079 Chest pain, unspecified: Secondary | ICD-10-CM | POA: Diagnosis not present

## 2020-05-18 DIAGNOSIS — E782 Mixed hyperlipidemia: Secondary | ICD-10-CM | POA: Diagnosis not present

## 2020-05-18 DIAGNOSIS — F5101 Primary insomnia: Secondary | ICD-10-CM | POA: Diagnosis not present

## 2020-05-18 DIAGNOSIS — T466X5S Adverse effect of antihyperlipidemic and antiarteriosclerotic drugs, sequela: Secondary | ICD-10-CM | POA: Diagnosis not present

## 2020-05-18 DIAGNOSIS — Z0189 Encounter for other specified special examinations: Secondary | ICD-10-CM | POA: Diagnosis not present

## 2020-05-18 DIAGNOSIS — I10 Essential (primary) hypertension: Secondary | ICD-10-CM | POA: Diagnosis not present

## 2020-05-24 ENCOUNTER — Other Ambulatory Visit: Payer: Self-pay

## 2020-05-24 ENCOUNTER — Ambulatory Visit (INDEPENDENT_AMBULATORY_CARE_PROVIDER_SITE_OTHER): Payer: 59 | Admitting: Internal Medicine

## 2020-05-24 ENCOUNTER — Encounter (INDEPENDENT_AMBULATORY_CARE_PROVIDER_SITE_OTHER): Payer: Self-pay | Admitting: Internal Medicine

## 2020-05-24 VITALS — BP 113/71 | HR 71 | Temp 98.7°F | Ht 68.0 in | Wt 201.0 lb

## 2020-05-24 DIAGNOSIS — K2270 Barrett's esophagus without dysplasia: Secondary | ICD-10-CM

## 2020-05-24 DIAGNOSIS — R198 Other specified symptoms and signs involving the digestive system and abdomen: Secondary | ICD-10-CM | POA: Diagnosis not present

## 2020-05-24 DIAGNOSIS — K219 Gastro-esophageal reflux disease without esophagitis: Secondary | ICD-10-CM

## 2020-05-24 MED ORDER — B COMPLEX-C PO TABS: 1.0000 | ORAL_TABLET | Freq: Every day | ORAL | Status: AC

## 2020-05-24 NOTE — Patient Instructions (Addendum)
B complex with vitamin C or tablet daily. Can stop Centrum Silver while taking this medication or can take both together. If no improvement noted in dry mouth or abnormal mouth sensation 2 months can stop B complex. If this symptom improves please let us know. Use dicyclomine only on as-needed basis.

## 2020-05-24 NOTE — Progress Notes (Signed)
Presenting complaint;  Follow-up for chronic GERD.  Database and subjective:  Patient is 59 year old Caucasian male who was chronic GERD complicated by short segment Barrett's esophagus as well as history of borborygmi on low-dose dicyclomine who is here for scheduled visit.  He was last seen 1 year ago. Last EGD was in February 2020 revealing short segment Barrett's esophagus.  Biopsy was negative for dysplasia. His last screening colonoscopy was in October 2013.  He states he is doing well as far as GERD is concerned.  He rarely has heartburn or regurgitation and he denies dysphagia hoarseness or chronic cough.  He takes famotidine OTC maybe every other day or every third day if he eats certain foods that might give him heartburn.  However he complains of abnormal sensation in his mouth.  He wonders if it is due to dry mouth as he snores and is a mouth breather.  He does not have pyrosis.  He also does not have any soreness or ulcers in his mouth.  He states he has had the sensation long before he had Covid in fall 2020.  His bowels move daily.  He denies melena or rectal bleeding.  He reports dicyclomine helps control borborygmi.  He does not have cramping diarrhea or urgency.  Current Medications: Outpatient Encounter Medications as of 05/24/2020  Medication Sig  . aspirin 81 MG tablet Take 1 tablet (81 mg total) by mouth every evening.  . dicyclomine (BENTYL) 10 MG capsule Take 1 capsule (10 mg total) by mouth 2 (two) times daily as needed.  Marland Kitchen esomeprazole (NEXIUM) 40 MG capsule TAKE 1 CAPSULE (40 MG TOTAL) BY MOUTH DAILY BEFORE BREAKFAST.  Marland Kitchen ezetimibe (ZETIA) 10 MG tablet Take 10 mg by mouth daily.  . famotidine (PEPCID) 20 MG tablet Take 1 tablet (20 mg total) by mouth at bedtime.  Marland Kitchen loratadine-pseudoephedrine (CLARITIN-D 12 HOUR) 5-120 MG tablet Take 1 tablet by mouth 2 (two) times daily. (Patient taking differently: Take 1 tablet by mouth 2 (two) times daily as needed for allergies. )  .  Multiple Vitamins-Minerals (CENTRUM SILVER PO) Take 1 tablet by mouth daily.   Marland Kitchen olmesartan-hydrochlorothiazide (BENICAR HCT) 20-12.5 MG per tablet Take 1 tablet by mouth daily.    Vladimir Faster Glycol-Propyl Glycol (LUBRICANT EYE DROPS) 0.4-0.3 % SOLN Place 1 drop into both eyes 3 (three) times daily as needed (dry/irritated eyes.).  Marland Kitchen sildenafil (VIAGRA) 100 MG tablet Take 100 mg by mouth daily as needed for erectile dysfunction.   No facility-administered encounter medications on file as of 05/24/2020.    Objective: Blood pressure 113/71, pulse 71, temperature 98.7 F (37.1 C), height 5\' 8"  (1.727 m), weight 201 lb (91.2 kg). Patient is alert and in no acute distress. He is wearing a mask. Conjunctiva is pink. Sclera is nonicteric Oropharyngeal mucosa is normal. No neck masses or thyromegaly noted. Cardiac exam with regular rhythm normal S1 and S2. No murmur or gallop noted. Lungs are clear to auscultation. Abdomen is full but soft and nontender with organomegaly or masses. No LE edema or clubbing noted.  Assessment:  #1.  Chronic GERD complicated by short segment Barrett's esophagus.  Heartburn is well controlled with therapy.  He is on single dose PPI.  He is using famotidine less than 15 times in a month.  Will consider dropping PPI dose at his next visit if he continues to do well. Next surveillance EGD would be in February 2025.  #2.  Altered sensation in mouth.  He does not have any  evidence of Candida or mucositis.  I am not sure how to explain the symptoms.  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  #3.  Borborygmi.  This symptom is bothersome to the patient and he is getting relief with as needed dicyclomine.  #4.  He is average risk for CRC.  Next colonoscopy in October 2023.   Plan:  Patient will try B complex with vitamin C 1 daily for few weeks and see if it helps with altered oral sensation.  He can continue or stop Centrum Silver while he is taking B  complex. Use dicyclomine on as-needed basis. Patient will continue esomeprazole 40 mg every morning and famotidine OTC at bedtime as needed. Progress report in 2 months and office visit in 1 year.

## 2020-06-11 ENCOUNTER — Other Ambulatory Visit (INDEPENDENT_AMBULATORY_CARE_PROVIDER_SITE_OTHER): Payer: Self-pay | Admitting: Internal Medicine

## 2020-06-11 MED FILL — SILDENAFIL CITRATE 100 MG T: 100 | 30 days supply | Qty: 6 | Fill #5

## 2020-06-13 MED FILL — DICYCLOMINE 10 MG CAPSULE: 10 | 90 days supply | Qty: 180 | Fill #0

## 2020-06-13 MED FILL — OLMESARTAN-HCTZ 20-12.5 MG: 20-12.5 | 90 days supply | Qty: 90 | Fill #0

## 2020-06-16 DIAGNOSIS — Z20822 Contact with and (suspected) exposure to covid-19: Secondary | ICD-10-CM | POA: Diagnosis not present

## 2020-06-24 MED FILL — EZETIMIBE 10 MG TABS: 10 | 90 days supply | Qty: 90 | Fill #1

## 2020-08-01 MED FILL — ESOMEPRAZOLE MAG DR 40 MG C: 40 | 90 days supply | Qty: 90 | Fill #1

## 2020-09-08 DIAGNOSIS — Z0189 Encounter for other specified special examinations: Secondary | ICD-10-CM | POA: Diagnosis not present

## 2020-09-08 DIAGNOSIS — I10 Essential (primary) hypertension: Secondary | ICD-10-CM | POA: Diagnosis not present

## 2020-09-08 DIAGNOSIS — K219 Gastro-esophageal reflux disease without esophagitis: Secondary | ICD-10-CM | POA: Diagnosis not present

## 2020-09-08 DIAGNOSIS — E782 Mixed hyperlipidemia: Secondary | ICD-10-CM | POA: Diagnosis not present

## 2020-09-09 MED FILL — OLMESARTAN-HCTZ 20-12.5 MG: 20-12.5 | 90 days supply | Qty: 90 | Fill #1

## 2020-09-14 ENCOUNTER — Other Ambulatory Visit (HOSPITAL_COMMUNITY): Payer: Self-pay | Admitting: Family Medicine

## 2020-09-14 DIAGNOSIS — K219 Gastro-esophageal reflux disease without esophagitis: Secondary | ICD-10-CM | POA: Diagnosis not present

## 2020-09-14 DIAGNOSIS — F5101 Primary insomnia: Secondary | ICD-10-CM | POA: Diagnosis not present

## 2020-09-14 DIAGNOSIS — T466X5S Adverse effect of antihyperlipidemic and antiarteriosclerotic drugs, sequela: Secondary | ICD-10-CM | POA: Diagnosis not present

## 2020-09-14 DIAGNOSIS — R7303 Prediabetes: Secondary | ICD-10-CM | POA: Diagnosis not present

## 2020-09-14 DIAGNOSIS — I10 Essential (primary) hypertension: Secondary | ICD-10-CM | POA: Diagnosis not present

## 2020-09-14 DIAGNOSIS — E782 Mixed hyperlipidemia: Secondary | ICD-10-CM | POA: Diagnosis not present

## 2020-09-14 DIAGNOSIS — F5221 Male erectile disorder: Secondary | ICD-10-CM | POA: Diagnosis not present

## 2020-09-14 DIAGNOSIS — R079 Chest pain, unspecified: Secondary | ICD-10-CM | POA: Diagnosis not present

## 2020-09-14 DIAGNOSIS — E6609 Other obesity due to excess calories: Secondary | ICD-10-CM | POA: Diagnosis not present

## 2020-09-14 MED FILL — ZOLPIDEM TARTRATE 10 MG TAB: 10 | 90 days supply | Qty: 90 | Fill #0

## 2020-09-14 MED FILL — EZETIMIBE 10 MG TABS: 10 | 90 days supply | Qty: 90 | Fill #0

## 2020-09-14 MED FILL — SILDENAFIL CITRATE 100 MG T: 100 | 60 days supply | Qty: 12 | Fill #0

## 2020-10-26 MED FILL — ESOMEPRAZOLE MAG DR 40 MG C: 40 | 90 days supply | Qty: 90 | Fill #0

## 2020-12-07 MED FILL — OLMESARTAN-HCTZ 20-12.5 MG: 20-12.5 | 90 days supply | Qty: 90 | Fill #0

## 2020-12-07 MED FILL — EZETIMIBE 10 MG TABS: 10 | 90 days supply | Qty: 90 | Fill #1

## 2020-12-07 MED FILL — SILDENAFIL CITRATE 100 MG T: 100 | 30 days supply | Qty: 6 | Fill #0

## 2021-01-26 ENCOUNTER — Other Ambulatory Visit (HOSPITAL_COMMUNITY): Payer: Self-pay

## 2021-01-26 MED FILL — Esomeprazole Magnesium Cap Delayed Release 40 MG (Base Eq): ORAL | 90 days supply | Qty: 90 | Fill #0 | Status: AC

## 2021-01-27 ENCOUNTER — Other Ambulatory Visit (HOSPITAL_COMMUNITY): Payer: Self-pay

## 2021-01-31 ENCOUNTER — Other Ambulatory Visit (HOSPITAL_COMMUNITY): Payer: Self-pay

## 2021-02-01 ENCOUNTER — Other Ambulatory Visit (HOSPITAL_COMMUNITY): Payer: Self-pay

## 2021-02-01 MED ORDER — SILDENAFIL CITRATE 100 MG PO TABS
100.0000 mg | ORAL_TABLET | ORAL | 0 refills | Status: DC
  Filled 2021-02-01: qty 6, 30d supply, fill #0
  Filled 2021-04-23: qty 6, 30d supply, fill #1

## 2021-02-02 ENCOUNTER — Other Ambulatory Visit (HOSPITAL_COMMUNITY): Payer: Self-pay

## 2021-02-03 ENCOUNTER — Other Ambulatory Visit (HOSPITAL_COMMUNITY): Payer: Self-pay

## 2021-02-23 ENCOUNTER — Other Ambulatory Visit (INDEPENDENT_AMBULATORY_CARE_PROVIDER_SITE_OTHER): Payer: Self-pay | Admitting: Internal Medicine

## 2021-02-23 ENCOUNTER — Other Ambulatory Visit (HOSPITAL_COMMUNITY): Payer: Self-pay

## 2021-02-23 MED ORDER — DICYCLOMINE HCL 10 MG PO CAPS
10.0000 mg | ORAL_CAPSULE | Freq: Two times a day (BID) | ORAL | 1 refills | Status: DC | PRN
  Filled 2021-02-23: qty 180, 90d supply, fill #0
  Filled 2021-11-04: qty 180, 90d supply, fill #1

## 2021-02-23 NOTE — Telephone Encounter (Signed)
Noted to discuss with Dr. Rehman. 

## 2021-02-27 MED FILL — Olmesartan Medoxomil-Hydrochlorothiazide Tab 20-12.5 MG: ORAL | 90 days supply | Qty: 90 | Fill #0 | Status: AC

## 2021-02-27 MED FILL — Ezetimibe Tab 10 MG: ORAL | 90 days supply | Qty: 90 | Fill #0 | Status: AC

## 2021-02-28 ENCOUNTER — Other Ambulatory Visit (HOSPITAL_COMMUNITY): Payer: Self-pay

## 2021-03-02 ENCOUNTER — Other Ambulatory Visit (HOSPITAL_COMMUNITY): Payer: Self-pay

## 2021-04-23 MED FILL — Esomeprazole Magnesium Cap Delayed Release 40 MG (Base Eq): ORAL | 90 days supply | Qty: 90 | Fill #1 | Status: AC

## 2021-04-25 ENCOUNTER — Other Ambulatory Visit (HOSPITAL_COMMUNITY): Payer: Self-pay

## 2021-05-27 MED FILL — Olmesartan Medoxomil-Hydrochlorothiazide Tab 20-12.5 MG: ORAL | 90 days supply | Qty: 90 | Fill #1 | Status: AC

## 2021-05-27 MED FILL — Ezetimibe Tab 10 MG: ORAL | 90 days supply | Qty: 90 | Fill #1 | Status: AC

## 2021-05-29 ENCOUNTER — Other Ambulatory Visit (HOSPITAL_COMMUNITY): Payer: Self-pay

## 2021-05-30 ENCOUNTER — Other Ambulatory Visit: Payer: Self-pay

## 2021-05-30 ENCOUNTER — Encounter (INDEPENDENT_AMBULATORY_CARE_PROVIDER_SITE_OTHER): Payer: Self-pay | Admitting: Internal Medicine

## 2021-05-30 ENCOUNTER — Ambulatory Visit (INDEPENDENT_AMBULATORY_CARE_PROVIDER_SITE_OTHER): Payer: 59 | Admitting: Internal Medicine

## 2021-05-30 ENCOUNTER — Other Ambulatory Visit (HOSPITAL_COMMUNITY): Payer: Self-pay

## 2021-05-30 VITALS — BP 133/79 | HR 70 | Temp 98.3°F | Ht 68.0 in | Wt 201.0 lb

## 2021-05-30 DIAGNOSIS — R198 Other specified symptoms and signs involving the digestive system and abdomen: Secondary | ICD-10-CM

## 2021-05-30 DIAGNOSIS — K2270 Barrett's esophagus without dysplasia: Secondary | ICD-10-CM | POA: Diagnosis not present

## 2021-05-30 DIAGNOSIS — K219 Gastro-esophageal reflux disease without esophagitis: Secondary | ICD-10-CM | POA: Diagnosis not present

## 2021-05-30 MED ORDER — ESOMEPRAZOLE MAGNESIUM 40 MG PO CPDR
40.0000 mg | DELAYED_RELEASE_CAPSULE | Freq: Every day | ORAL | 3 refills | Status: AC
  Filled 2021-05-30 – 2021-07-31 (×2): qty 90, 90d supply, fill #0
  Filled 2021-11-04: qty 90, 90d supply, fill #1
  Filled 2022-02-05: qty 90, 90d supply, fill #2
  Filled 2022-04-30: qty 90, 90d supply, fill #3

## 2021-05-30 NOTE — Progress Notes (Signed)
Presenting complaint;  Follow for chronic GERD and borborygmi  Database and subjective:  Patient is 60 year old Caucasian male is here for yearly visit. He several year history of GERD status post laparoscopic Nissen fundoplication in April [id number] which provided relief for only for 2 years.  He has been back on PPI since May 11, 2011.  He underwent EGD in February 2021 revealing irregular GE junction and biopsy confirmed Barrett's mucosa.  His wrap was noted to be loose.  He has been maintained on esomeprazole in the morning and famotidine at bedtime for breakthrough symptoms. He also has history of abdominal rumbling. He states he is doing much better.  He rarely has heartburn.  He may take 4-6 doses of famotidine per month.  He he has figured out that his oral symptoms of discomfort is due to dry mucosa.  It occurs usually at night.  He feels probiotic has helped reduce borborygmi.  He is not having cramping or diarrhea.  He denies melena or rectal bleeding.  His bowels move daily.  He also denies dysphagia nausea or vomiting. 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 He is due for yearly blood work in November 2022.  His primary care physician his Dr. Nevada Crane. His last colonoscopy was normal in November 2013.  His family history is negative for colorectal carcinoma.   Current Medications: Outpatient Encounter Medications as of 05/30/2021  Medication Sig   aspirin 81 MG tablet Take 1 tablet (81 mg total) by mouth every evening.   B Complex-C (B-COMPLEX WITH VITAMIN C) tablet Take 1 tablet by mouth daily.   dicyclomine (BENTYL) 10 MG capsule Take 1 capsule (10 mg total) by mouth 2 (two) times daily as needed.   esomeprazole (NEXIUM) 40 MG capsule TAKE 1 CAPSULE BY MOUTH ONCE DAILY   ezetimibe (ZETIA) 10 MG tablet TAKE 1 TABLET BY MOUTH ONCE DAILY   famotidine (PEPCID) 20 MG tablet Take 1 tablet (20 mg total) by mouth at bedtime.    loratadine-pseudoephedrine (CLARITIN-D 12 HOUR) 5-120 MG tablet Take 1 tablet by mouth 2 (two) times daily. (Patient taking differently: Take 1 tablet by mouth 2 (two) times daily as needed for allergies.)   Multiple Vitamins-Minerals (CENTRUM SILVER PO) Take 1 tablet by mouth daily.    olmesartan-hydrochlorothiazide (BENICAR HCT) 20-12.5 MG per tablet Take 1 tablet by mouth daily.     olmesartan-hydrochlorothiazide (BENICAR HCT) 20-12.5 MG tablet TAKE 1 TABLET BY MOUTH ONCE DAILY   Polyethyl Glycol-Propyl Glycol 0.4-0.3 % SOLN Place 1 drop into both eyes 3 (three) times daily as needed (dry/irritated eyes.).   sildenafil (VIAGRA) 100 MG tablet Take 100 mg by mouth daily as needed for erectile dysfunction.   sildenafil (VIAGRA) 100 MG tablet TAKE 1 TABLET BY MOUTH 30 MINUTES PRIOR TO SEXUAL ACTIVITY   sildenafil (VIAGRA) 100 MG tablet TAKE 1 TABLET BY MOUTH DAILY AS NEEDED   sildenafil (VIAGRA) 100 MG tablet Take 1 tablet (100 mg total) by mouth as directed 30 minutes before sexual activity   zolpidem (AMBIEN) 10 MG tablet TAKE 1 TABLET BY MOUTH NIGHTLY AS NEEDED   [DISCONTINUED] ezetimibe (ZETIA) 10 MG tablet Take 10 mg by mouth daily.   No facility-administered encounter medications on file as of 05/30/2021.     Objective: Blood pressure 133/79, pulse 70, temperature 98.3 F (36.8 C), temperature source Oral, height '5\' 8"'$  (1.727 m), weight 201 lb (91.2 kg). Patient is alert  and in no acute distress. He is wearing a mask. Oropharyngeal mucosa is normal. Conjunctiva is pink. Sclera is nonicteric Oropharyngeal mucosa is normal. No neck masses or thyromegaly noted. Cardiac exam with regular rhythm normal S1 and S2. No murmur or gallop noted. Lungs are clear to auscultation. Abdomen is full but soft and nontender with organomegaly or masses. No LE edema or clubbing noted.  Assessment:  #1.  Chronic GERD complicated by short segment Barrett's esophagus.  Laparoscopic surgery in April 2010  provided relief for about 2 years.  EGD 2-1/2 years ago revealed short segment Barrett's esophagus.  Symptom control is satisfactory but single dose of PPI and sporadic famotidine.  #2.  Borborygmi.  Probiotics seem to have helped him.  He is using dicyclomine no more than once a week.  #3.  Patient is average risk for CRC.  Next screening due in November 2023.   Plan:  New prescription given for Nexium/esomeprazole 40 mg p.o. every morning 90 doses with 3 refills. He will call office if he needs prescription for dicyclomine. Office visit in 1 year.

## 2021-05-30 NOTE — Patient Instructions (Signed)
Notify if Nexium stops working or you have swallowing difficulty

## 2021-06-07 ENCOUNTER — Other Ambulatory Visit (HOSPITAL_COMMUNITY): Payer: Self-pay

## 2021-06-07 MED ORDER — SILDENAFIL CITRATE 100 MG PO TABS
100.0000 mg | ORAL_TABLET | ORAL | 0 refills | Status: DC
  Filled 2021-06-07: qty 12, 12d supply, fill #0

## 2021-07-31 ENCOUNTER — Other Ambulatory Visit (HOSPITAL_COMMUNITY): Payer: Self-pay

## 2021-08-28 ENCOUNTER — Other Ambulatory Visit (HOSPITAL_COMMUNITY): Payer: Self-pay

## 2021-08-29 ENCOUNTER — Other Ambulatory Visit (HOSPITAL_COMMUNITY): Payer: Self-pay

## 2021-08-29 MED ORDER — SILDENAFIL CITRATE 100 MG PO TABS
100.0000 mg | ORAL_TABLET | ORAL | 0 refills | Status: DC
  Filled 2021-08-29: qty 5, 30d supply, fill #0
  Filled 2021-11-04: qty 5, 30d supply, fill #1
  Filled 2021-12-19: qty 2, 2d supply, fill #2

## 2021-08-30 ENCOUNTER — Other Ambulatory Visit (HOSPITAL_COMMUNITY): Payer: Self-pay

## 2021-08-30 MED FILL — Olmesartan Medoxomil-Hydrochlorothiazide Tab 20-12.5 MG: ORAL | 90 days supply | Qty: 90 | Fill #2 | Status: AC

## 2021-08-31 ENCOUNTER — Other Ambulatory Visit (HOSPITAL_COMMUNITY): Payer: Self-pay

## 2021-09-04 ENCOUNTER — Other Ambulatory Visit (HOSPITAL_COMMUNITY): Payer: Self-pay

## 2021-09-05 ENCOUNTER — Other Ambulatory Visit (HOSPITAL_COMMUNITY): Payer: Self-pay

## 2021-09-06 ENCOUNTER — Other Ambulatory Visit (HOSPITAL_COMMUNITY): Payer: Self-pay

## 2021-09-07 ENCOUNTER — Other Ambulatory Visit (HOSPITAL_COMMUNITY): Payer: Self-pay

## 2021-09-25 DIAGNOSIS — I10 Essential (primary) hypertension: Secondary | ICD-10-CM | POA: Diagnosis not present

## 2021-09-28 ENCOUNTER — Other Ambulatory Visit (HOSPITAL_COMMUNITY): Payer: Self-pay

## 2021-09-28 DIAGNOSIS — K589 Irritable bowel syndrome without diarrhea: Secondary | ICD-10-CM | POA: Diagnosis not present

## 2021-09-28 DIAGNOSIS — R7303 Prediabetes: Secondary | ICD-10-CM | POA: Diagnosis not present

## 2021-09-28 DIAGNOSIS — K219 Gastro-esophageal reflux disease without esophagitis: Secondary | ICD-10-CM | POA: Diagnosis not present

## 2021-09-28 DIAGNOSIS — F5221 Male erectile disorder: Secondary | ICD-10-CM | POA: Diagnosis not present

## 2021-09-28 DIAGNOSIS — F5101 Primary insomnia: Secondary | ICD-10-CM | POA: Diagnosis not present

## 2021-09-28 DIAGNOSIS — I10 Essential (primary) hypertension: Secondary | ICD-10-CM | POA: Diagnosis not present

## 2021-09-28 DIAGNOSIS — Z0001 Encounter for general adult medical examination with abnormal findings: Secondary | ICD-10-CM | POA: Diagnosis not present

## 2021-09-28 DIAGNOSIS — E782 Mixed hyperlipidemia: Secondary | ICD-10-CM | POA: Diagnosis not present

## 2021-09-28 MED ORDER — SILDENAFIL CITRATE 100 MG PO TABS
100.0000 mg | ORAL_TABLET | ORAL | 0 refills | Status: AC | PRN
  Filled 2021-09-28: qty 6, 30d supply, fill #0

## 2021-09-28 MED ORDER — OLMESARTAN MEDOXOMIL-HCTZ 40-25 MG PO TABS
1.0000 | ORAL_TABLET | Freq: Every day | ORAL | 1 refills | Status: AC
  Filled 2021-09-28: qty 90, 90d supply, fill #0

## 2021-10-02 ENCOUNTER — Other Ambulatory Visit (HOSPITAL_COMMUNITY): Payer: Self-pay

## 2021-10-02 MED ORDER — OLMESARTAN MEDOXOMIL-HCTZ 40-12.5 MG PO TABS
1.0000 | ORAL_TABLET | Freq: Every day | ORAL | 3 refills | Status: DC
  Filled 2021-10-02: qty 90, 90d supply, fill #0
  Filled 2021-12-25: qty 90, 90d supply, fill #1
  Filled 2022-03-20: qty 90, 90d supply, fill #2
  Filled 2022-06-17: qty 90, 90d supply, fill #3

## 2021-10-03 ENCOUNTER — Other Ambulatory Visit (HOSPITAL_COMMUNITY): Payer: Self-pay

## 2021-10-03 MED ORDER — EZETIMIBE 10 MG PO TABS
10.0000 mg | ORAL_TABLET | Freq: Every day | ORAL | 1 refills | Status: DC
  Filled 2021-10-03: qty 90, 90d supply, fill #0
  Filled 2021-12-25: qty 90, 90d supply, fill #1

## 2021-10-04 ENCOUNTER — Other Ambulatory Visit (HOSPITAL_COMMUNITY): Payer: Self-pay

## 2021-11-04 ENCOUNTER — Other Ambulatory Visit (HOSPITAL_COMMUNITY): Payer: Self-pay

## 2021-11-06 ENCOUNTER — Other Ambulatory Visit (HOSPITAL_COMMUNITY): Payer: Self-pay

## 2021-11-08 ENCOUNTER — Other Ambulatory Visit (HOSPITAL_COMMUNITY): Payer: Self-pay

## 2021-12-19 ENCOUNTER — Other Ambulatory Visit (HOSPITAL_COMMUNITY): Payer: Self-pay

## 2021-12-20 ENCOUNTER — Other Ambulatory Visit (HOSPITAL_COMMUNITY): Payer: Self-pay

## 2021-12-20 MED ORDER — SILDENAFIL CITRATE 100 MG PO TABS
100.0000 mg | ORAL_TABLET | ORAL | 0 refills | Status: DC
  Filled 2021-12-20: qty 6, 30d supply, fill #0
  Filled 2022-02-05: qty 6, 30d supply, fill #1

## 2021-12-25 ENCOUNTER — Other Ambulatory Visit (HOSPITAL_COMMUNITY): Payer: Self-pay

## 2021-12-26 ENCOUNTER — Other Ambulatory Visit (HOSPITAL_COMMUNITY): Payer: Self-pay

## 2022-02-05 ENCOUNTER — Other Ambulatory Visit (HOSPITAL_COMMUNITY): Payer: Self-pay

## 2022-03-08 DIAGNOSIS — D225 Melanocytic nevi of trunk: Secondary | ICD-10-CM | POA: Diagnosis not present

## 2022-03-08 DIAGNOSIS — L308 Other specified dermatitis: Secondary | ICD-10-CM | POA: Diagnosis not present

## 2022-03-20 ENCOUNTER — Other Ambulatory Visit (HOSPITAL_COMMUNITY): Payer: Self-pay

## 2022-03-20 ENCOUNTER — Other Ambulatory Visit (INDEPENDENT_AMBULATORY_CARE_PROVIDER_SITE_OTHER): Payer: Self-pay | Admitting: Internal Medicine

## 2022-03-21 ENCOUNTER — Other Ambulatory Visit (HOSPITAL_COMMUNITY): Payer: Self-pay

## 2022-03-21 MED ORDER — SILDENAFIL CITRATE 100 MG PO TABS
100.0000 mg | ORAL_TABLET | ORAL | 0 refills | Status: DC
  Filled 2022-03-21: qty 5, 30d supply, fill #0
  Filled 2022-04-30: qty 5, 30d supply, fill #1
  Filled 2022-06-17: qty 2, 10d supply, fill #2

## 2022-03-21 MED ORDER — EZETIMIBE 10 MG PO TABS
10.0000 mg | ORAL_TABLET | Freq: Every day | ORAL | 1 refills | Status: DC
  Filled 2022-03-21: qty 90, 90d supply, fill #0
  Filled 2022-06-17: qty 90, 90d supply, fill #1

## 2022-03-22 ENCOUNTER — Other Ambulatory Visit (HOSPITAL_COMMUNITY): Payer: Self-pay

## 2022-03-22 MED ORDER — DICYCLOMINE HCL 10 MG PO CAPS
10.0000 mg | ORAL_CAPSULE | Freq: Two times a day (BID) | ORAL | 1 refills | Status: AC | PRN
  Filled 2022-03-22: qty 100, 50d supply, fill #0
  Filled 2022-03-22: qty 80, 40d supply, fill #0
  Filled 2022-06-29: qty 180, 90d supply, fill #1

## 2022-03-22 NOTE — Telephone Encounter (Signed)
Last seen 05-30-21

## 2022-04-30 ENCOUNTER — Other Ambulatory Visit (HOSPITAL_COMMUNITY): Payer: Self-pay

## 2022-06-18 ENCOUNTER — Other Ambulatory Visit (HOSPITAL_COMMUNITY): Payer: Self-pay

## 2022-06-18 MED ORDER — SILDENAFIL CITRATE 100 MG PO TABS
100.0000 mg | ORAL_TABLET | ORAL | 0 refills | Status: DC
  Filled 2022-06-18: qty 5, 30d supply, fill #0
  Filled 2022-08-04: qty 5, 30d supply, fill #1
  Filled 2022-10-07: qty 5, 30d supply, fill #2

## 2022-06-29 ENCOUNTER — Other Ambulatory Visit (HOSPITAL_COMMUNITY): Payer: Self-pay

## 2022-08-04 ENCOUNTER — Other Ambulatory Visit (INDEPENDENT_AMBULATORY_CARE_PROVIDER_SITE_OTHER): Payer: Self-pay | Admitting: Internal Medicine

## 2022-08-06 ENCOUNTER — Other Ambulatory Visit (HOSPITAL_COMMUNITY): Payer: Self-pay

## 2022-08-14 ENCOUNTER — Encounter (INDEPENDENT_AMBULATORY_CARE_PROVIDER_SITE_OTHER): Payer: Self-pay | Admitting: *Deleted

## 2022-09-23 ENCOUNTER — Other Ambulatory Visit (HOSPITAL_COMMUNITY): Payer: Self-pay

## 2022-09-25 ENCOUNTER — Other Ambulatory Visit (HOSPITAL_COMMUNITY): Payer: Self-pay

## 2022-09-25 MED ORDER — OLMESARTAN MEDOXOMIL-HCTZ 40-12.5 MG PO TABS
1.0000 | ORAL_TABLET | Freq: Every day | ORAL | 3 refills | Status: DC
  Filled 2022-09-25: qty 90, 90d supply, fill #0
  Filled 2022-10-07 – 2022-12-22 (×2): qty 90, 90d supply, fill #1
  Filled 2023-03-27: qty 90, 90d supply, fill #0
  Filled 2023-03-27: qty 90, 90d supply, fill #2
  Filled 2023-07-10: qty 90, 90d supply, fill #1

## 2022-09-25 MED ORDER — EZETIMIBE 10 MG PO TABS
10.0000 mg | ORAL_TABLET | Freq: Every day | ORAL | 1 refills | Status: AC
  Filled 2022-09-25: qty 90, 90d supply, fill #0
  Filled 2022-10-07 – 2022-12-22 (×2): qty 90, 90d supply, fill #1

## 2022-10-07 ENCOUNTER — Other Ambulatory Visit (INDEPENDENT_AMBULATORY_CARE_PROVIDER_SITE_OTHER): Payer: Self-pay | Admitting: Internal Medicine

## 2022-10-08 ENCOUNTER — Other Ambulatory Visit (HOSPITAL_COMMUNITY): Payer: Self-pay

## 2022-10-08 ENCOUNTER — Other Ambulatory Visit: Payer: Self-pay

## 2022-10-08 MED ORDER — SILDENAFIL CITRATE 100 MG PO TABS
100.0000 mg | ORAL_TABLET | ORAL | 0 refills | Status: AC
  Filled 2022-10-08: qty 10, 10d supply, fill #0

## 2022-10-16 ENCOUNTER — Other Ambulatory Visit (HOSPITAL_COMMUNITY): Payer: Self-pay

## 2022-11-12 DIAGNOSIS — U071 COVID-19: Secondary | ICD-10-CM | POA: Diagnosis not present

## 2022-11-12 DIAGNOSIS — J209 Acute bronchitis, unspecified: Secondary | ICD-10-CM | POA: Diagnosis not present

## 2022-12-19 ENCOUNTER — Other Ambulatory Visit (HOSPITAL_COMMUNITY): Payer: Self-pay

## 2022-12-22 ENCOUNTER — Other Ambulatory Visit (HOSPITAL_COMMUNITY): Payer: Self-pay

## 2022-12-24 ENCOUNTER — Other Ambulatory Visit: Payer: Self-pay

## 2023-02-05 DIAGNOSIS — J069 Acute upper respiratory infection, unspecified: Secondary | ICD-10-CM | POA: Diagnosis not present

## 2023-02-05 DIAGNOSIS — J209 Acute bronchitis, unspecified: Secondary | ICD-10-CM | POA: Diagnosis not present

## 2023-02-05 DIAGNOSIS — J019 Acute sinusitis, unspecified: Secondary | ICD-10-CM | POA: Diagnosis not present

## 2023-03-27 ENCOUNTER — Other Ambulatory Visit (HOSPITAL_COMMUNITY): Payer: Self-pay

## 2023-03-27 ENCOUNTER — Other Ambulatory Visit: Payer: Self-pay

## 2023-03-27 MED ORDER — EZETIMIBE 10 MG PO TABS
10.0000 mg | ORAL_TABLET | Freq: Every day | ORAL | 1 refills | Status: DC
  Filled 2023-03-27 – 2023-03-30 (×2): qty 90, 90d supply, fill #0
  Filled 2023-07-10: qty 90, 90d supply, fill #1

## 2023-03-30 ENCOUNTER — Other Ambulatory Visit (HOSPITAL_COMMUNITY): Payer: Self-pay

## 2023-04-01 ENCOUNTER — Other Ambulatory Visit (HOSPITAL_COMMUNITY): Payer: Self-pay

## 2023-04-01 ENCOUNTER — Other Ambulatory Visit: Payer: Self-pay

## 2023-04-02 ENCOUNTER — Other Ambulatory Visit: Payer: Self-pay

## 2023-05-03 DIAGNOSIS — E782 Mixed hyperlipidemia: Secondary | ICD-10-CM | POA: Diagnosis not present

## 2023-05-03 DIAGNOSIS — I10 Essential (primary) hypertension: Secondary | ICD-10-CM | POA: Diagnosis not present

## 2023-05-09 ENCOUNTER — Other Ambulatory Visit (HOSPITAL_COMMUNITY): Payer: Self-pay

## 2023-05-09 MED ORDER — OLMESARTAN MEDOXOMIL-HCTZ 40-25 MG PO TABS
1.0000 | ORAL_TABLET | Freq: Every day | ORAL | 3 refills | Status: AC
  Filled 2023-05-09: qty 90, 90d supply, fill #0

## 2023-05-10 ENCOUNTER — Other Ambulatory Visit (HOSPITAL_COMMUNITY): Payer: Self-pay

## 2023-05-10 ENCOUNTER — Other Ambulatory Visit: Payer: Self-pay

## 2023-05-10 MED ORDER — SILDENAFIL CITRATE 100 MG PO TABS
100.0000 mg | ORAL_TABLET | ORAL | 1 refills | Status: DC
Start: 2023-05-09 — End: 2024-06-02
  Filled 2023-05-10: qty 6, 30d supply, fill #0
  Filled 2023-07-10: qty 6, 30d supply, fill #1
  Filled 2023-08-08 (×2): qty 6, 30d supply, fill #2
  Filled 2023-10-08: qty 6, 30d supply, fill #3
  Filled 2024-01-05: qty 6, 30d supply, fill #4
  Filled 2024-02-08: qty 6, 30d supply, fill #5
  Filled 2024-03-30: qty 6, 30d supply, fill #6
  Filled 2024-05-06: qty 6, 30d supply, fill #7

## 2023-05-10 MED ORDER — EZETIMIBE 10 MG PO TABS: 10.0000 mg | ORAL_TABLET | Freq: Every day | ORAL | 3 refills | Status: AC

## 2023-05-10 MED ORDER — ESOMEPRAZOLE MAGNESIUM 40 MG PO CPDR
40.0000 mg | DELAYED_RELEASE_CAPSULE | Freq: Every day | ORAL | 3 refills | Status: DC
  Filled 2023-05-10: qty 90, 90d supply, fill #0
  Filled 2023-08-08 (×2): qty 90, 90d supply, fill #1
  Filled 2023-11-04: qty 90, 90d supply, fill #2
  Filled 2024-02-08: qty 90, 90d supply, fill #3

## 2023-05-10 MED ORDER — DICYCLOMINE HCL 10 MG PO CAPS
10.0000 mg | ORAL_CAPSULE | Freq: Three times a day (TID) | ORAL | 3 refills | Status: DC | PRN
  Filled 2023-05-10: qty 90, 30d supply, fill #0
  Filled 2023-11-04: qty 90, 30d supply, fill #1
  Filled 2024-01-05: qty 90, 30d supply, fill #2
  Filled 2024-03-30: qty 90, 30d supply, fill #3

## 2023-05-10 MED ORDER — ZOLPIDEM TARTRATE 10 MG PO TABS
10.0000 mg | ORAL_TABLET | Freq: Every evening | ORAL | 3 refills | Status: AC | PRN
  Filled 2023-05-10 – 2023-07-10 (×2): qty 90, 90d supply, fill #0
  Filled 2023-10-08: qty 90, 90d supply, fill #1

## 2023-05-17 ENCOUNTER — Other Ambulatory Visit (HOSPITAL_COMMUNITY): Payer: Self-pay

## 2023-05-20 ENCOUNTER — Other Ambulatory Visit (HOSPITAL_COMMUNITY): Payer: Self-pay

## 2023-05-22 DIAGNOSIS — Z1211 Encounter for screening for malignant neoplasm of colon: Secondary | ICD-10-CM | POA: Diagnosis not present

## 2023-07-10 ENCOUNTER — Other Ambulatory Visit: Payer: Self-pay

## 2023-07-10 ENCOUNTER — Other Ambulatory Visit (HOSPITAL_COMMUNITY): Payer: Self-pay

## 2023-08-08 ENCOUNTER — Other Ambulatory Visit (HOSPITAL_BASED_OUTPATIENT_CLINIC_OR_DEPARTMENT_OTHER): Payer: Self-pay

## 2023-08-08 ENCOUNTER — Other Ambulatory Visit (HOSPITAL_COMMUNITY): Payer: Self-pay

## 2023-10-08 ENCOUNTER — Other Ambulatory Visit: Payer: Self-pay

## 2023-10-08 ENCOUNTER — Other Ambulatory Visit (HOSPITAL_COMMUNITY): Payer: Self-pay

## 2023-10-08 MED ORDER — EZETIMIBE 10 MG PO TABS
10.0000 mg | ORAL_TABLET | Freq: Every day | ORAL | 1 refills | Status: DC
  Filled 2023-10-08: qty 90, 90d supply, fill #0
  Filled 2024-01-05: qty 90, 90d supply, fill #1

## 2023-10-08 MED ORDER — OLMESARTAN MEDOXOMIL-HCTZ 40-12.5 MG PO TABS
1.0000 | ORAL_TABLET | Freq: Every day | ORAL | 3 refills | Status: AC
  Filled 2023-10-08: qty 90, 90d supply, fill #0
  Filled 2024-01-05: qty 90, 90d supply, fill #1
  Filled 2024-03-30: qty 90, 90d supply, fill #2

## 2023-11-04 ENCOUNTER — Other Ambulatory Visit (HOSPITAL_COMMUNITY): Payer: Self-pay

## 2024-01-06 ENCOUNTER — Other Ambulatory Visit (HOSPITAL_COMMUNITY): Payer: Self-pay

## 2024-02-08 ENCOUNTER — Other Ambulatory Visit (HOSPITAL_COMMUNITY): Payer: Self-pay

## 2024-03-30 ENCOUNTER — Other Ambulatory Visit (HOSPITAL_COMMUNITY): Payer: Self-pay

## 2024-03-31 ENCOUNTER — Other Ambulatory Visit (HOSPITAL_COMMUNITY): Payer: Self-pay

## 2024-03-31 MED ORDER — EZETIMIBE 10 MG PO TABS
10.0000 mg | ORAL_TABLET | Freq: Every day | ORAL | 1 refills | Status: DC
  Filled 2024-03-31: qty 90, 90d supply, fill #0
  Filled 2024-07-03: qty 90, 90d supply, fill #1

## 2024-05-06 ENCOUNTER — Other Ambulatory Visit: Payer: Self-pay

## 2024-05-06 ENCOUNTER — Other Ambulatory Visit (HOSPITAL_COMMUNITY): Payer: Self-pay

## 2024-05-06 MED ORDER — ESOMEPRAZOLE MAGNESIUM 40 MG PO CPDR
40.0000 mg | DELAYED_RELEASE_CAPSULE | Freq: Every day | ORAL | 3 refills | Status: AC
  Filled 2024-05-06: qty 90, 90d supply, fill #0
  Filled 2024-08-03: qty 90, 90d supply, fill #1
  Filled 2024-10-29: qty 90, 90d supply, fill #2

## 2024-05-29 DIAGNOSIS — Z125 Encounter for screening for malignant neoplasm of prostate: Secondary | ICD-10-CM | POA: Diagnosis not present

## 2024-05-29 DIAGNOSIS — R7303 Prediabetes: Secondary | ICD-10-CM | POA: Diagnosis not present

## 2024-05-29 DIAGNOSIS — I10 Essential (primary) hypertension: Secondary | ICD-10-CM | POA: Diagnosis not present

## 2024-06-02 ENCOUNTER — Other Ambulatory Visit (HOSPITAL_COMMUNITY): Payer: Self-pay

## 2024-06-02 MED ORDER — OLMESARTAN MEDOXOMIL-HCTZ 40-25 MG PO TABS
1.0000 | ORAL_TABLET | Freq: Every day | ORAL | 3 refills | Status: AC
  Filled 2024-06-02 – 2024-06-03 (×3): qty 90, 90d supply, fill #0

## 2024-06-02 MED ORDER — ESOMEPRAZOLE MAGNESIUM 40 MG PO CPDR
40.0000 mg | DELAYED_RELEASE_CAPSULE | Freq: Every day | ORAL | 3 refills | Status: AC
  Filled 2024-06-02: qty 90, 90d supply, fill #0

## 2024-06-02 MED ORDER — EZETIMIBE 10 MG PO TABS
10.0000 mg | ORAL_TABLET | Freq: Every day | ORAL | 3 refills | Status: AC
  Filled 2024-06-02: qty 90, 90d supply, fill #0

## 2024-06-02 MED ORDER — ZOLPIDEM TARTRATE 10 MG PO TABS
10.0000 mg | ORAL_TABLET | Freq: Every evening | ORAL | 0 refills | Status: DC | PRN
  Filled 2024-06-02: qty 30, 30d supply, fill #0

## 2024-06-02 MED ORDER — SILDENAFIL CITRATE 100 MG PO TABS
ORAL_TABLET | ORAL | 1 refills | Status: AC
  Filled 2024-06-02: qty 90, 90d supply, fill #0
  Filled 2024-10-05: qty 30, 30d supply, fill #1

## 2024-06-02 MED ORDER — DICYCLOMINE HCL 10 MG PO CAPS
10.0000 mg | ORAL_CAPSULE | Freq: Three times a day (TID) | ORAL | 3 refills | Status: AC | PRN
  Filled 2024-06-02: qty 90, 30d supply, fill #0

## 2024-06-03 ENCOUNTER — Other Ambulatory Visit (HOSPITAL_COMMUNITY): Payer: Self-pay

## 2024-06-03 ENCOUNTER — Other Ambulatory Visit: Payer: Self-pay

## 2024-07-03 ENCOUNTER — Other Ambulatory Visit (HOSPITAL_COMMUNITY): Payer: Self-pay

## 2024-08-03 ENCOUNTER — Other Ambulatory Visit (HOSPITAL_COMMUNITY): Payer: Self-pay

## 2024-08-05 ENCOUNTER — Other Ambulatory Visit (HOSPITAL_COMMUNITY): Payer: Self-pay

## 2024-10-05 ENCOUNTER — Other Ambulatory Visit (HOSPITAL_COMMUNITY): Payer: Self-pay

## 2024-10-05 ENCOUNTER — Other Ambulatory Visit: Payer: Self-pay

## 2024-10-05 MED ORDER — ZOLPIDEM TARTRATE 10 MG PO TABS
10.0000 mg | ORAL_TABLET | Freq: Every evening | ORAL | 0 refills | Status: AC | PRN
  Filled 2024-10-05: qty 30, 30d supply, fill #0

## 2024-10-07 ENCOUNTER — Other Ambulatory Visit (HOSPITAL_COMMUNITY): Payer: Self-pay

## 2024-10-07 ENCOUNTER — Other Ambulatory Visit: Payer: Self-pay

## 2024-10-07 MED ORDER — EZETIMIBE 10 MG PO TABS
10.0000 mg | ORAL_TABLET | Freq: Every day | ORAL | 1 refills | Status: AC
  Filled 2024-10-07: qty 90, 90d supply, fill #0

## 2024-10-29 ENCOUNTER — Other Ambulatory Visit (HOSPITAL_COMMUNITY): Payer: Self-pay

## 2024-10-29 ENCOUNTER — Other Ambulatory Visit: Payer: Self-pay
